# Patient Record
Sex: Female | Born: 1960 | Race: Black or African American | Hispanic: No | Smoking: Current every day smoker
Health system: Southern US, Community
[De-identification: ages and names within clinical notes are randomized; demographics above are authoritative.]

## PROBLEM LIST (undated history)

## (undated) DIAGNOSIS — F329 Major depressive disorder, single episode, unspecified: Secondary | ICD-10-CM

## (undated) DIAGNOSIS — I10 Essential (primary) hypertension: Secondary | ICD-10-CM

## (undated) DIAGNOSIS — M1990 Unspecified osteoarthritis, unspecified site: Secondary | ICD-10-CM

## (undated) DIAGNOSIS — F32A Depression, unspecified: Secondary | ICD-10-CM

## (undated) DIAGNOSIS — E7800 Pure hypercholesterolemia, unspecified: Secondary | ICD-10-CM

## (undated) DIAGNOSIS — M779 Enthesopathy, unspecified: Secondary | ICD-10-CM

## (undated) HISTORY — PX: JOINT REPLACEMENT: SHX530

## (undated) HISTORY — PX: TUBAL LIGATION: SHX77

## (undated) HISTORY — PX: HAND SURGERY: SHX662

---

## 1997-07-30 ENCOUNTER — Ambulatory Visit (HOSPITAL_COMMUNITY): Admission: RE | Admit: 1997-07-30 | Discharge: 1997-07-30 | Payer: Self-pay | Admitting: Internal Medicine

## 1997-12-25 ENCOUNTER — Other Ambulatory Visit: Admission: RE | Admit: 1997-12-25 | Discharge: 1997-12-25 | Payer: Self-pay | Admitting: Obstetrics and Gynecology

## 1998-01-25 HISTORY — PX: FOOT SURGERY: SHX648

## 1998-04-29 ENCOUNTER — Encounter: Payer: Self-pay | Admitting: Neurosurgery

## 1998-04-29 ENCOUNTER — Ambulatory Visit (HOSPITAL_COMMUNITY): Admission: RE | Admit: 1998-04-29 | Discharge: 1998-04-29 | Payer: Self-pay | Admitting: Neurosurgery

## 1998-07-18 ENCOUNTER — Ambulatory Visit (HOSPITAL_BASED_OUTPATIENT_CLINIC_OR_DEPARTMENT_OTHER): Admission: RE | Admit: 1998-07-18 | Discharge: 1998-07-18 | Payer: Self-pay | Admitting: Orthopedic Surgery

## 1998-12-30 ENCOUNTER — Emergency Department (HOSPITAL_COMMUNITY): Admission: EM | Admit: 1998-12-30 | Discharge: 1998-12-30 | Payer: Self-pay | Admitting: Emergency Medicine

## 1999-02-18 ENCOUNTER — Ambulatory Visit (HOSPITAL_COMMUNITY): Admission: RE | Admit: 1999-02-18 | Discharge: 1999-02-18 | Payer: Self-pay | Admitting: Orthopedic Surgery

## 1999-02-18 ENCOUNTER — Encounter: Payer: Self-pay | Admitting: Orthopedic Surgery

## 1999-03-09 ENCOUNTER — Emergency Department (HOSPITAL_COMMUNITY): Admission: EM | Admit: 1999-03-09 | Discharge: 1999-03-09 | Payer: Self-pay

## 1999-05-06 ENCOUNTER — Encounter: Admission: RE | Admit: 1999-05-06 | Discharge: 1999-05-06 | Payer: Self-pay | Admitting: Otolaryngology

## 1999-05-06 ENCOUNTER — Encounter: Payer: Self-pay | Admitting: Otolaryngology

## 1999-07-13 ENCOUNTER — Other Ambulatory Visit: Admission: RE | Admit: 1999-07-13 | Discharge: 1999-07-13 | Payer: Self-pay | Admitting: *Deleted

## 2000-07-14 ENCOUNTER — Other Ambulatory Visit: Admission: RE | Admit: 2000-07-14 | Discharge: 2000-07-14 | Payer: Self-pay | Admitting: *Deleted

## 2000-12-18 ENCOUNTER — Emergency Department (HOSPITAL_COMMUNITY): Admission: EM | Admit: 2000-12-18 | Discharge: 2000-12-18 | Payer: Self-pay | Admitting: Emergency Medicine

## 2003-01-10 ENCOUNTER — Emergency Department (HOSPITAL_COMMUNITY): Admission: EM | Admit: 2003-01-10 | Discharge: 2003-01-11 | Payer: Self-pay | Admitting: Emergency Medicine

## 2003-06-19 ENCOUNTER — Encounter (INDEPENDENT_AMBULATORY_CARE_PROVIDER_SITE_OTHER): Payer: Self-pay | Admitting: *Deleted

## 2003-06-19 ENCOUNTER — Ambulatory Visit (HOSPITAL_COMMUNITY): Admission: RE | Admit: 2003-06-19 | Discharge: 2003-06-19 | Payer: Self-pay | Admitting: Gastroenterology

## 2005-07-03 ENCOUNTER — Emergency Department (HOSPITAL_COMMUNITY): Admission: EM | Admit: 2005-07-03 | Discharge: 2005-07-03 | Payer: Self-pay | Admitting: Emergency Medicine

## 2006-02-23 ENCOUNTER — Encounter: Admission: RE | Admit: 2006-02-23 | Discharge: 2006-05-24 | Payer: Self-pay | Admitting: Unknown Physician Specialty

## 2006-09-22 ENCOUNTER — Inpatient Hospital Stay (HOSPITAL_COMMUNITY): Admission: RE | Admit: 2006-09-22 | Discharge: 2006-09-25 | Payer: Self-pay | Admitting: Orthopedic Surgery

## 2006-12-28 ENCOUNTER — Emergency Department (HOSPITAL_COMMUNITY): Admission: EM | Admit: 2006-12-28 | Discharge: 2006-12-28 | Payer: Self-pay | Admitting: Emergency Medicine

## 2007-02-14 ENCOUNTER — Ambulatory Visit (HOSPITAL_COMMUNITY): Admission: RE | Admit: 2007-02-14 | Discharge: 2007-02-15 | Payer: Self-pay | Admitting: Orthopaedic Surgery

## 2007-03-08 ENCOUNTER — Other Ambulatory Visit: Admission: RE | Admit: 2007-03-08 | Discharge: 2007-03-08 | Payer: Self-pay | Admitting: Family Medicine

## 2007-08-05 ENCOUNTER — Emergency Department (HOSPITAL_COMMUNITY): Admission: EM | Admit: 2007-08-05 | Discharge: 2007-08-06 | Payer: Self-pay | Admitting: Emergency Medicine

## 2007-08-09 ENCOUNTER — Encounter: Admission: RE | Admit: 2007-08-09 | Discharge: 2007-08-09 | Payer: Self-pay | Admitting: Family Medicine

## 2008-05-31 ENCOUNTER — Encounter: Admission: RE | Admit: 2008-05-31 | Discharge: 2008-05-31 | Payer: Self-pay | Admitting: Family Medicine

## 2008-06-08 IMAGING — CR DG KNEE 1-2V PORT*R*
2 series · 2 of 2 positions shown · non-contrast
Comparison: None

CLINICAL DATA: Postop total knee replacement

RIGHT KNEE - 2 VIEW

[view not recorded (1 of 2)]
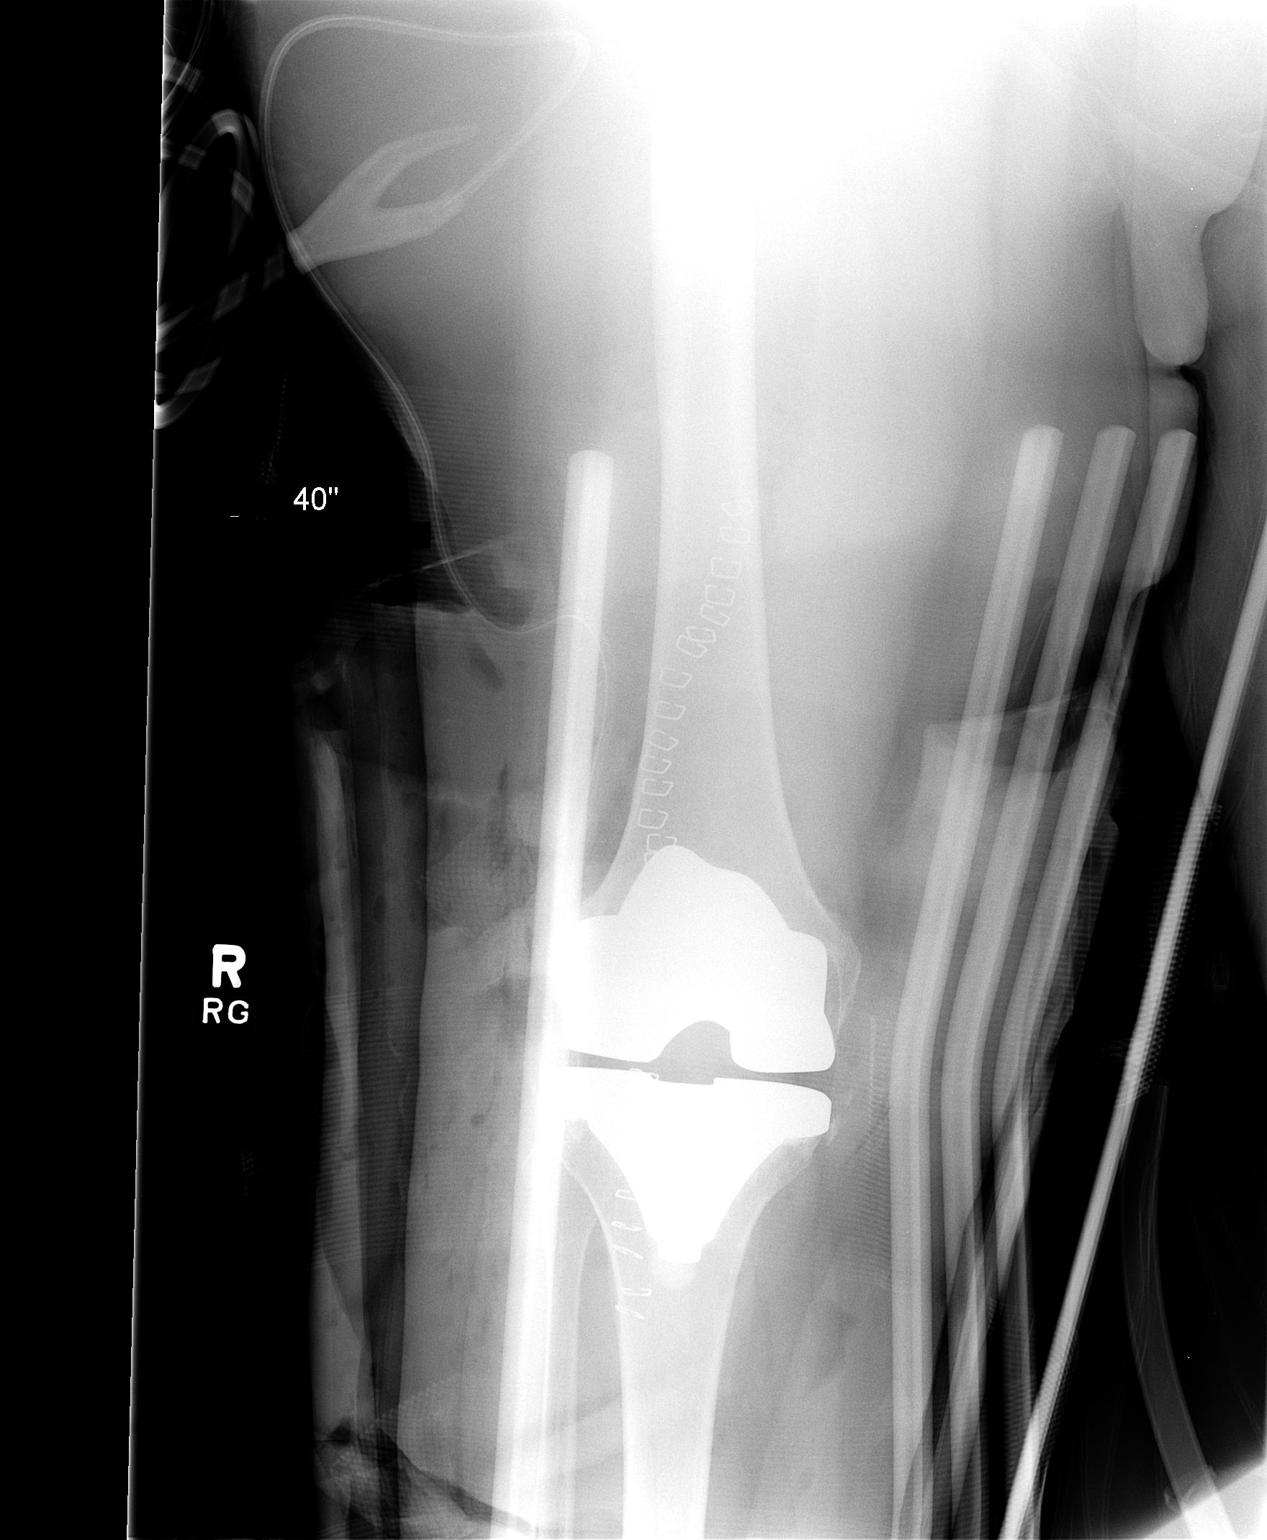

[view not recorded (2 of 2)]
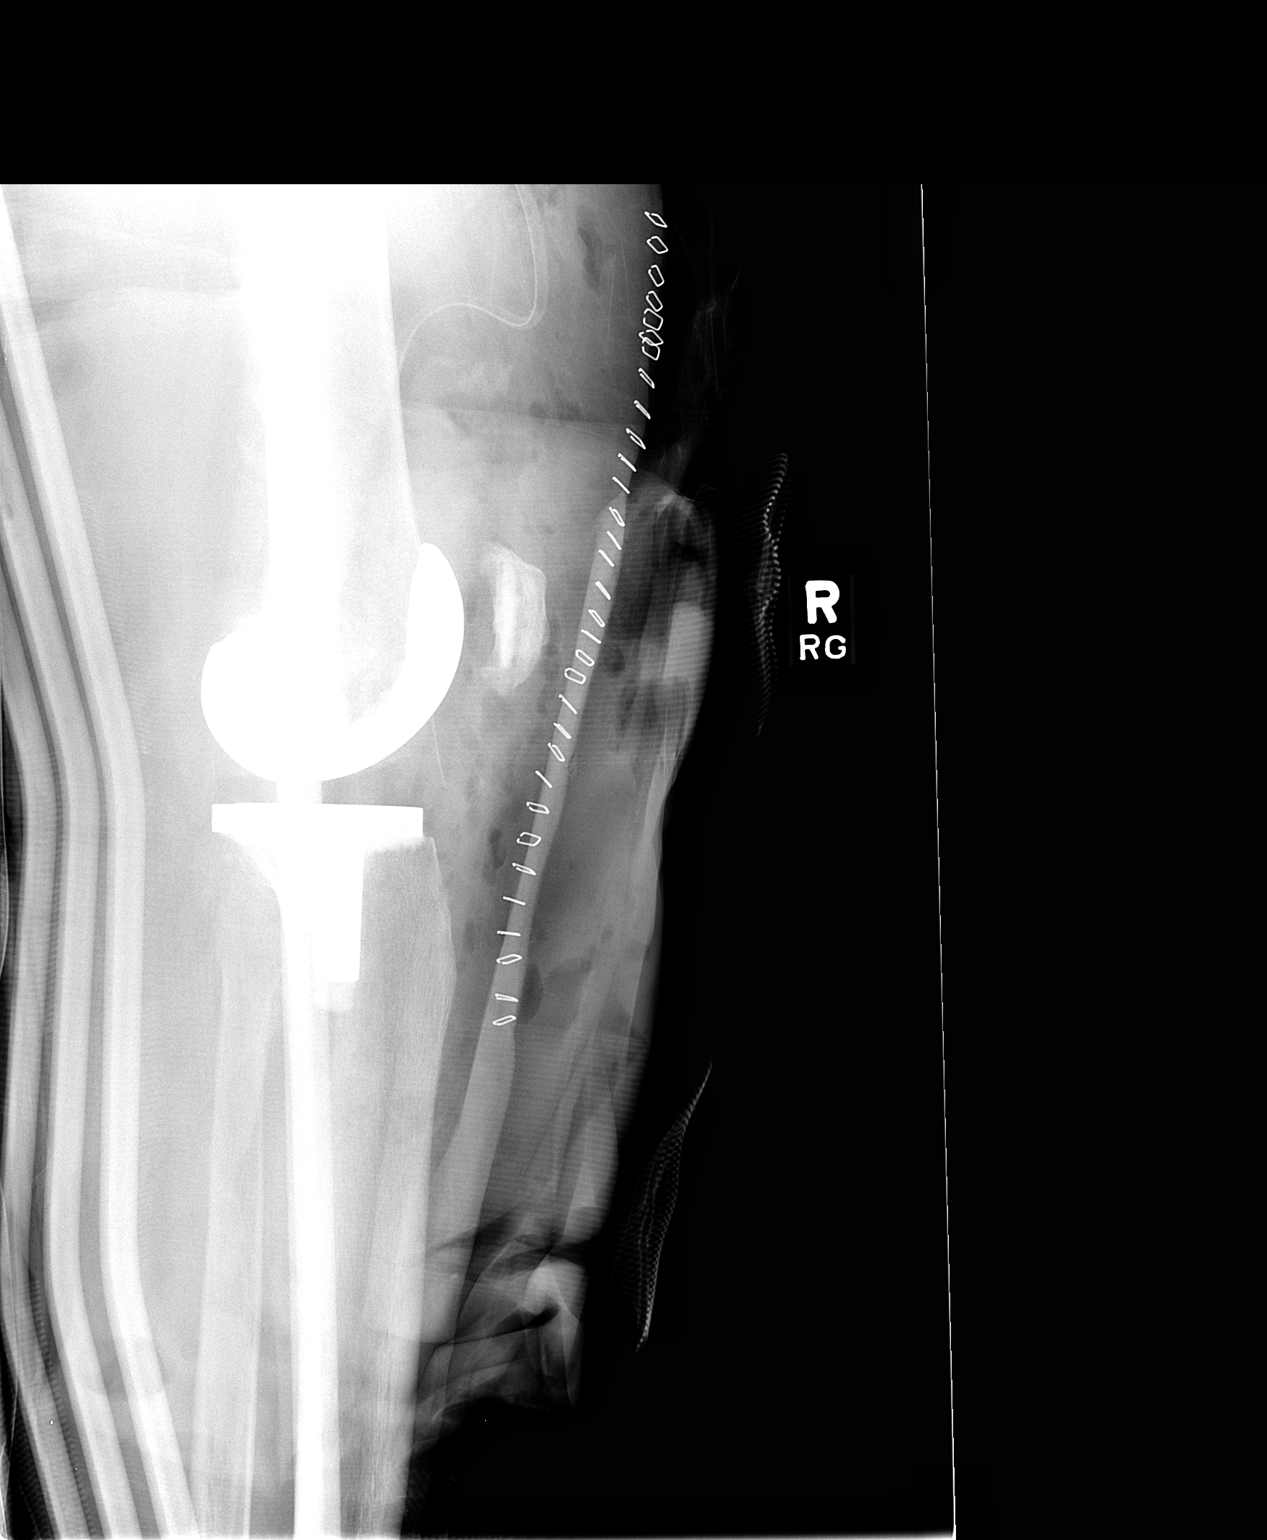

[2 of 2 positions shown; findings below may reference images not displayed]

FINDINGS: Status post total knee arthroplasty. No hardware complication
identified. Expected air and fluid within the joint.

IMPRESSION

Expected appearance after total knee arthroplasty.

## 2008-07-05 ENCOUNTER — Ambulatory Visit (HOSPITAL_COMMUNITY): Admission: RE | Admit: 2008-07-05 | Discharge: 2008-07-05 | Payer: Self-pay | Admitting: Neurosurgery

## 2008-07-06 ENCOUNTER — Emergency Department (HOSPITAL_COMMUNITY): Admission: EM | Admit: 2008-07-06 | Discharge: 2008-07-06 | Payer: Self-pay | Admitting: Emergency Medicine

## 2008-09-23 ENCOUNTER — Emergency Department (HOSPITAL_BASED_OUTPATIENT_CLINIC_OR_DEPARTMENT_OTHER): Admission: EM | Admit: 2008-09-23 | Discharge: 2008-09-23 | Payer: Self-pay | Admitting: Emergency Medicine

## 2008-09-23 ENCOUNTER — Ambulatory Visit: Payer: Self-pay | Admitting: Diagnostic Radiology

## 2008-10-18 ENCOUNTER — Emergency Department (HOSPITAL_COMMUNITY): Admission: EM | Admit: 2008-10-18 | Discharge: 2008-10-19 | Payer: Self-pay | Admitting: Emergency Medicine

## 2008-10-18 ENCOUNTER — Emergency Department (HOSPITAL_COMMUNITY): Admission: EM | Admit: 2008-10-18 | Discharge: 2008-10-18 | Payer: Self-pay | Admitting: Emergency Medicine

## 2009-04-10 ENCOUNTER — Other Ambulatory Visit: Admission: RE | Admit: 2009-04-10 | Discharge: 2009-04-10 | Payer: Self-pay | Admitting: Family Medicine

## 2009-10-19 ENCOUNTER — Emergency Department (HOSPITAL_COMMUNITY): Admission: EM | Admit: 2009-10-19 | Discharge: 2009-10-19 | Payer: Self-pay | Admitting: Emergency Medicine

## 2010-01-25 HISTORY — PX: KNEE SURGERY: SHX244

## 2010-01-25 HISTORY — PX: SHOULDER SURGERY: SHX246

## 2010-05-01 LAB — WET PREP, GENITAL
Clue Cells Wet Prep HPF POC: NONE SEEN
Trich, Wet Prep: NONE SEEN
WBC, Wet Prep HPF POC: NONE SEEN
Yeast Wet Prep HPF POC: NONE SEEN

## 2010-05-01 LAB — CBC
HCT: 39.4 % (ref 36.0–46.0)
HCT: 40.5 % (ref 36.0–46.0)
Hemoglobin: 12.9 g/dL (ref 12.0–15.0)
Hemoglobin: 13.1 g/dL (ref 12.0–15.0)
MCHC: 32.3 g/dL (ref 30.0–36.0)
MCHC: 32.9 g/dL (ref 30.0–36.0)
MCV: 88.2 fL (ref 78.0–100.0)
MCV: 89 fL (ref 78.0–100.0)
Platelets: 176 10*3/uL (ref 150–400)
Platelets: 182 10*3/uL (ref 150–400)
RBC: 4.46 MIL/uL (ref 3.87–5.11)
RBC: 4.55 MIL/uL (ref 3.87–5.11)
RDW: 15 % (ref 11.5–15.5)
RDW: 15.2 % (ref 11.5–15.5)
WBC: 10.6 10*3/uL — ABNORMAL HIGH (ref 4.0–10.5)
WBC: 9.6 10*3/uL (ref 4.0–10.5)

## 2010-05-01 LAB — DIFFERENTIAL
Basophils Absolute: 0 10*3/uL (ref 0.0–0.1)
Basophils Relative: 0 % (ref 0–1)
Eosinophils Absolute: 0.1 10*3/uL (ref 0.0–0.7)
Eosinophils Relative: 1 % (ref 0–5)
Lymphocytes Relative: 21 % (ref 12–46)
Lymphocytes Relative: 32 % (ref 12–46)
Lymphs Abs: 2.2 10*3/uL (ref 0.7–4.0)
Lymphs Abs: 3.1 10*3/uL (ref 0.7–4.0)
Monocytes Absolute: 0.5 10*3/uL (ref 0.1–1.0)
Monocytes Relative: 6 % (ref 3–12)
Neutro Abs: 5.9 10*3/uL (ref 1.7–7.7)
Neutrophils Relative %: 61 % (ref 43–77)
Neutrophils Relative %: 72 % (ref 43–77)

## 2010-05-01 LAB — URINALYSIS, ROUTINE W REFLEX MICROSCOPIC
Bilirubin Urine: NEGATIVE
Glucose, UA: NEGATIVE mg/dL
Hgb urine dipstick: NEGATIVE
Ketones, ur: NEGATIVE mg/dL
Nitrite: NEGATIVE
Protein, ur: NEGATIVE mg/dL
Specific Gravity, Urine: 1.018 (ref 1.005–1.030)
Urobilinogen, UA: 0.2 mg/dL (ref 0.0–1.0)
pH: 5 (ref 5.0–8.0)

## 2010-05-01 LAB — URINE MICROSCOPIC-ADD ON

## 2010-05-01 LAB — BASIC METABOLIC PANEL
BUN: 7 mg/dL (ref 6–23)
CO2: 25 mEq/L (ref 19–32)
Calcium: 9.2 mg/dL (ref 8.4–10.5)
Chloride: 105 mEq/L (ref 96–112)
Creatinine, Ser: 0.66 mg/dL (ref 0.4–1.2)
GFR calc Af Amer: >60 mL/min (ref >60)
GFR calc non Af Amer: >60 mL/min (ref >60)
Glucose, Bld: 97 mg/dL (ref 70–99)
Potassium: 4.2 mEq/L (ref 3.5–5.1)
Sodium: 135 mEq/L (ref 135–145)

## 2010-05-01 LAB — GC/CHLAMYDIA PROBE AMP, GENITAL
Chlamydia, DNA Probe: NEGATIVE
GC Probe Amp, Genital: NEGATIVE

## 2010-05-04 LAB — URINALYSIS, ROUTINE W REFLEX MICROSCOPIC
Nitrite: NEGATIVE
Specific Gravity, Urine: 1.033 — ABNORMAL HIGH (ref 1.005–1.030)
Urobilinogen, UA: 1 mg/dL (ref 0.0–1.0)

## 2010-06-09 NOTE — Op Note (Signed)
NAMEMAISYN, Kaitlyn NO.:  [id number]   MEDICAL RECORD NO.:  [id number]          PATIENT TYPE:  OIB   LOCATION:  5019                         FACILITY:  [facility]   PHYSICIAN:  Vanita Panda. 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   POSTOPERATIVE DIAGNOSIS:  1. Chronic right shoulder acromioclavicular separation.  2. Right shoulder impingement with bursitis.  3. Right shoulder partial thickness under surface rotator cuff tear.   PROCEDURE:  1. Right shoulder arthroscopy with extensive debridement.  2. Right shoulder arthroscopic subacromial decompression.  3. Right shoulder open distal clavicle resection.   SURGEON:  Vanita Panda. Magnus Ivan, M.D.   ANESTHESIA:  1. Right shoulder block.  2. General.   ANTIBIOTICS:  1 gram IV Ancef.   BLOOD LOSS:  Minimal.   COMPLICATIONS:  None.   INDICATIONS:  Briefly, Kaitlyn Schroeder is a 50 year old female who, over a  year ago, sustained an injury to her right shoulder.  She was found to  have a type 3 AC separation. This persisted for over the last year and  she started to develop a lot of pain in the [facility] joint, itself.  I did  perform weighted images of the shoulder in the office and showed no  increase in instability of the [facility] joint and films static and with the  weights in place showed no further movement. The clavicle did not have  much motion through it and her pain seemed to be in the [facility] joint.  I  recommended she undergo arthroscopic surgery with decompression with  debridement and an open distal clavicle resection.  This is mainly due  to the fact that I do not think ligament reconstruction is warranted at  this chronic date and especially with lack of motion of the [facility] joint, I  do not think I  could get this reduced sufficiently.  She did not have  evidence of instability and mainly just pain. The risks and benefits of  the surgery were explained to her, well understood, and she agreed to  proceed with surgery.   PROCEDURE DESCRIPTION:  After informed consent and the appropriate right  shoulder was marked, she was brought to the operating room and placed  supine on the operating table.  A block was obtained of the shoulder  before being brought back. General anesthesia was then obtained.  She  was positioned in a beach chair position with appropriate padding and  positioning of the head and neck as well as the down nonoperative arm.  The right arm was prepped and draped with DuraPrep and sterile drapes  and she was placed in in-line skeletal traction using a fishing pole  traction device and 10 pounds of weight with 45 degrees of forward  flexion and neutral rotation.  A time out was called and she was  identified as the correct patient and correct extremity.  I then  made a  posterior portal off the posterolateral edge of the acromion two  fingerbreadths distal and one fingerbreadth medial.  An incision was  made and a cannula was inserted into the glenohumeral joint.  The scope  was inserted and right away I went and made an anterior portal just  lateral to the coracoid process.  There was significant under surface  tearing of the rotator cuff but this was not a full thickness tear.  There was degenerative tearing of the labrum and synovitis in the  shoulder.  An arthroscopic soft tissue ablation wand was then inserted a  thorough debridement was carried out in the glenohumeral joint including  the under surface of the rotator cuff.  This was debrided, also, with  the arthroscopic shaver.  Next, the subacromial space was entered in the  posterior portal. I made a lateral portal just off the lateral edge of  the acromion.  There was significant thickened tissue and bursitis  all  around the rotator cuff with tendinosis, as well, and an arthroscopic  shaver was inserted.  I carried out a thorough bursectomy as well as  using a soft tissue ablation wand to clean the under surface of the  acromion at the [facility] joint where there was some bone spurs that I co-  planed using the high speed bur.  I then significantly cleaned off  tendinosis of the rotator cuff and found the rotator cuff to, otherwise,  be intact.  Next, all the instrumentation was removed from the shoulder  and I proceeded with the open portion of the case.  I made a small  incision directly over the [facility] joint and carried this down to the distal  clavicle.  I exposed the distal clavicle sharply and cleaned the soft  tissue debris and found significant bone spurring along this and  deformities of the cartilage. Using a high speed oscillating saw, I then  removed approximately 1.5 cm of bone and there was no further  derangement in the clavicle.  I thoroughly irrigated this wound and  closed the soft tissue over this with interrupted 2-0 FiberWire suture  followed by 0 Vicryl to supplement this, 0 Vicryl in the deep tissue,  and 2-0 Vicryl in the subcutaneous tissue.  The incision was closed with  interrupted 3-0 nylon as well as the portal incisions.  Xeroform  followed by a well padded dressing was applied.  The patient's shoulder  was placed in a shoulder sling.  She was awakened, extubated, and taken  to the recovery room in stable condition.      Vanita Panda. Magnus Ivan, M.D.  Electronically Signed     CYB/MEDQ  D:  02/14/2007  T:  02/14/2007  Job:  [id number]

## 2010-06-09 NOTE — Op Note (Signed)
NAMEELLARY, CASAMENTO NO.:  [id number]   MEDICAL RECORD NO.:  [id number]          PATIENT TYPE:  INP   LOCATION:  0001                         FACILITY:  [facility]   PHYSICIAN:  Marlowe Kays, M.D.  DATE OF BIRTH:  1960/10/10   DATE OF PROCEDURE:  09/22/2006  DATE OF DISCHARGE:                               OPERATIVE REPORT   PREOPERATIVE DIAGNOSIS:  Osteoarthritis of right knee.   POSTOPERATIVE DIAGNOSIS:  Osteoarthritis of right knee.   OPERATION:  Osteonics total knee replacement, right.   SURGEON:  Marlowe Kays, M.D.   ASSISTANT:  Mr. Adrian Blackwater.   ANESTHESIA:  Spinal.   PATHOLOGY AND JUSTIFICATION FOR PROCEDURE:  Significant valgus deformity  with bone-on-bone laterally and pain.  She had minimal flexion  contracture.   PROCEDURE:  Prophylactic antibiotics, satisfactory spinal anesthesia,  Foley catheter inserted.  Lateral hip stabilizer Sure Foot and pneumatic  tourniquet. Right leg was prepped with DuraPrep from tourniquet to ankle  and draped in sterile field.  Ioban employed.  Time-out performed.  A  vertical midline incision down to the patellar mechanism with median  parapatellar incision to open the joint.  Osteophytes.  She had  significant osteophytic changes in all compartments.  Osteophytes from  around the patella and femur were removed with a rongeur. Patellar  mechanism was freed up and knee flexed.  Most of the ACL and PCL were  removed at this time as well as anterior portion of both menisci.  I  made a 5/16 inch drill hole distal femur and followed this with the  canal finder and the axis aligner set for 5 degree valgus cut for the  right knee.  Took 10 mm of bone off the distal femur and then sized the  distal femur at a #7.  Scribe lines were placed and the distal femoral  cutting jig applied, making anterior and posterior cuts and posterior  and anterior chamfers.  I then made a leveling cut on the tibia and  removed the  remnants of the menisci and ACL and PCL.  I sized the tibia  to a #7 and using the 7 baseplate made my initial intramedullary drill  hole followed by step-cut drill, canal finder and the intramedullary rod  set for 4 mm cut off the depressed lateral tibial plateau.  Cut was made  90 degrees and after this cut was performed, I then placed the laminar  spreader and removed soft tissue and bone from the posterior femoral  condyles.  I then used the jig for creating both the patellar groove and  the notchplasty, both of which were performed.  I then went through a  trial reduction and found that a 10, perhaps a 12 mm spacer would be  appropriate.  Using an external rod and the tibial base plate with the  rod splitting bimalleolar distance I put scribe lines on the tibia for  tray placement later on.  Then with the knee in extension I sized the  patella at 26 and used the 10-mm recessed cutting jig with the jig being  biased to the medial side.  Then placed the jig for creating three  fixation holes and followed this with the trial patella trimming away  excess bone from around the perimeter.  I then stabilized the #7 tibial  base plate onto the plateau with three pins and used the tower apparatus  to ream for the tibial stem up to a 7 cemented.  I then water picked the  knee while methacrylate was being mixed.  I then individually glued in  the three components, starting first with the tibia, impacting it  tightly removing excess methyl methacrylate followed by the femur and  holding the knee in extension with 10 mm insert while we glued in the  patellar button and held it with a patellar clamp as well.  With the  methacrylate had hardened, we trimmed up small bits from around patella  and the other components and went through a another trial reduction,  this time with 12 mm spacer which gave [place] a better fit with the knee  going into neutral and good medial and lateral stability.  After   irrigating the wound well, we placed the final 12 mm posterior  stabilized insert, checking motion once again and extensive lateral  release of the patella was indicated and performed, patella tracked  nicely afterwards and placed Hemovac and closed the wound with two  layers of #1 Vicryl in the quadriceps tendon and two layers distally in  the synovium and capsule. Subcutaneous tissue was closed with the  combination of #1 and 2-0 Vicryl, staples in the skin.  Tourniquet was  released with an hour and 2 minutes of tourniquet time at 325 mmHg.  Betadine and Adaptic dry sterile dressing were applied.  Tolerated the  procedure well, was taken to recovery room satisfactory condition with  no known complications.           ______________________________  Marlowe Kays, M.D.     JA/MEDQ  D:  09/22/2006  T:  09/23/2006  Job:  [id number]

## 2010-06-12 NOTE — Discharge Summary (Signed)
Kaitlyn Schroeder, Kaitlyn Schroeder NO.:  [id number]   MEDICAL RECORD NO.:  [id number]          PATIENT TYPE:  INP   LOCATION:  1529                         FACILITY:  [facility]   PHYSICIAN:  Marlowe Kays, M.D.  DATE OF BIRTH:  1960-09-08   DATE OF ADMISSION:  09/22/2006  DATE OF DISCHARGE:  09/25/2006                               DISCHARGE SUMMARY   ADMITTING DIAGNOSES:  1. Osteoarthritis of right knee.  2. Hypertension.  3. Right ankle fracture (being treated by Dr. Chestine Spore).   DISCHARGE DIAGNOSES:  1. Osteoarthritis of right knee.  2. Hypertension.  3. Right ankle fracture (being treated by Dr. Chestine Spore).  4. Postoperative anemia.   OPERATION:  On September 22, 2006, the patient underwent Osteonics total  knee replacement arthroplasty of the right knee, D. Ashley Akin, PA-C,  assisted.   BRIEF HISTORY:  This 50 year old female with severe valgus deformity is  having more and more difficulty getting about secondary to laxity and  pain in the knee.  X-rays have shown bone-on-bone abutment to the  lateral compartment of the knee with a medial shift of the femur onto  the tibia.  After much discussion including risks and benefits of  surgery, we decided she would benefit with surgical intervention and was  admitted for the above procedure.   HOSPITAL COURSE:  The patient tolerated the surgery quite well, entered  into the total knee replacement protocol with eagerness.  Hemovac was  removed on the first postop day and dressing was changed on the second  postop day.  It was noted that she had a anemia with hemoglobin dropping  to 8.3.  She was transfused by Dr. Shon Baton with 2 units of PRBC bringing  her hemoglobin up to 10.  Occupational therapy found the patient  required very little instruction on ADLs as she was doing quite well,  walking in the hall 200+ feet.  Vital signs were stable.  Neurovascular  was intact in the operative extremity and incision was dry.  She was  discharged home to continue with home physical therapy per [place].   She was placed on Coumadin protocol postoperatively for prevention of  DVT and will continue so for four weeks after the date of surgery.   LABORATORY DATA:  Laboratory values in the hospital hematologically  showed a preoperative CBC completely within normal limits.  Final  hemoglobin was 10, hematocrit was 29.50.  Blood chemistries remained  normal.  Urinalysis negative for urinary tract infection.   Electrocardiogram showed normal sinus rhythm and a chest x-ray showed  small focus of opacity at the right base, favor atelectasis over  infection.   CONDITION ON DISCHARGE:  Improved, stable.   PLAN:  The patient discharged home in the care of her family to continue  with home physical therapy.  Use dry dressing to the knee as needed.  Continue with ice.  Use a knee immobilizer when she is up and about,  weightbearing as tolerated.   DISCHARGE MEDICATIONS:  From [place] are  1. Vicodin one to two q.4-6h. p.r.n. pain.  2. Robaxin 500  mg one tablet q.6h. p.r.n. muscle spasm.  3. Coumadin per pharmacy.  4. Trinsicon b.i.d.  5. Recommend she use a stool softener daily.   Return to see Dr. Simonne Come two weeks from the date of surgery.   OTHER DISCHARGE MEDICATIONS:  1. Avapro 300 mg tablet daily.  2. Calcium carbonate 500 mg daily w.c.  3. Nicotine patch versus her regular smoking.  4. Ultram for mild pain.   DIET:  Continue with diet that she was on prior to surgery.      Dooley L. Cherlynn June.    ______________________________  Marlowe Kays, M.D.    DLU/MEDQ  D:  10/05/2006  T:  10/05/2006  Job:  [id number]

## 2010-06-12 NOTE — H&P (Signed)
Kaitlyn Schroeder, Kaitlyn Schroeder NO.:  [id number]   MEDICAL RECORD NO.:  [id number]        PATIENT TYPE:  LINP   LOCATION:                               FACILITY:  [facility]   PHYSICIAN:  Marlowe Kays, M.D.  DATE OF BIRTH:  05-28-1960   DATE OF ADMISSION:  09/22/2006  DATE OF DISCHARGE:                              HISTORY & PHYSICAL   HISTORY OF PRESENT ILLNESS:  This 50 year old black female seen by [place]  for continuing progressive problems concerning her right knee.  She has  now developed a valgus deformity and has difficulty getting about.  There is no laxity to the knee, but she has pain and crepitus with range  of motion.  Standing x-rays show bone-on-bone abutment to the lateral  compartment of the knee joint with a medial shift to the femur onto the  tibia.  After much discussion including the risks and benefits of  surgery it was felt she would benefit from surgical intervention and is  scheduled for total knee replacement arthroplasty to the right knee.   Currently the patient is being treated for a right ankle fracture by Dr.  Chestine Spore of one of the urgent primary cares.  She now is wearing a Cam  walker fracture boot.  She is able to fully weightbear.  We have  reviewed the heights of the brace on the calf, and it should not  interfere at all with her postoperative rehabilitation.   PAST MEDICAL HISTORY:  She has a remote history of asthma and  bronchitis.  She is currently being treated for hypertension.  She has  some shortness of breath on exertion which is only rare.   CURRENT MEDICATIONS:  1. Benicar 40 mg.  2. Meloxicam 15 mg.  Will stop on September 14, 2006.  3. Hydrocodone for pain.  4. Carisoprodol for muscle spasms (Dr. Chestine Spore).   ALLERGIES:  She is allergic to CODEINE and CELEBREX.   PRIMARY CARE PHYSICIAN:  Dr. Louanna Raw is her primary care.   PAST SURGERIES:  Carpal tunnel release in 1990, tubal ligation about 24  years ago, tendinitis in 1999.   She had a dislocated shoulder on the  right in July 2007.   FAMILY HISTORY:  Positive for heart disease and hypertension in the  mother, diabetes in the father, and breast cancer in the family.   SOCIAL HISTORY:  The patient is single.  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  She smokes half-a-pack to a whole pack of  cigarettes per day (We have admonished her for this.  She will try to  quit).  No intake of alcohol products.  She has two children, and her  family will be major caregivers after her surgery.  Her home is a two-  story with 16 stairs inside and four outside.   REVIEW OF SYSTEMS:  CNS:  No seizures or paralysis, no double vision.  RESPIRATORY:  No productive cough, some shortness of breath.  No  dyspnea.  CARDIOVASCULAR:  No chest pain, no angina, no orthopnea.  GASTROINTESTINAL:  No  nausea, vomiting, melanotic stool.  GENITOURINARY:  The patient has some urinary frequency and nocturia.  MUSCULOSKELETAL:  Primarily in present illness.  She still has trouble with her right  shoulder from dislocation tendon injury last year.  She is doing fairly  well with the right ankle fracture.   PHYSICAL EXAMINATION:  GENERAL:  Alert, cooperative, friendly 45-year-  old black female accompanied by her daughter.  VITAL SIGNS:  Blood pressure 138/88, pulse 80 regular, respirations 12  unlabored.  HEENT:  Normocephalic, PERRLA, EOM's intact.  Oropharynx is clear.  CHEST:  Clear to auscultation, no rhonchi or rales.  HEART:  Regular rate and rhythm.  No murmurs are heard.  ABDOMEN:  Obese, soft, tender, liver spleen not felt.  GENITALIA/RECTAL/PELVIC/BREASTS:  Not done, not pertinent to present  illness.  EXTREMITIES:  The right knee as in present illness above.   ADMITTING DIAGNOSIS:  1. Osteoarthritis of the right knee.  2. Hypertension.  3. Right ankle fracture (being treated by Dr. Chestine Spore).   PLAN:  The patient will be admitted for right total knee replacement   arthroplasty.  Plan to have home health to help her family with her  rehabilitation.      Dooley L. Cherlynn June.    ______________________________  Marlowe Kays, M.D.    DLU/MEDQ  D:  09/05/2006  T:  09/06/2006  Job:  [id number]

## 2010-06-12 NOTE — Op Note (Signed)
NAME:  AUBURN, HESTER                            ACCOUNT NO.:  [id number]   MEDICAL RECORD NO.:  [id number]                   PATIENT TYPE:  AMB   LOCATION:  ENDO                                 FACILITY:  [facility]   PHYSICIAN:  Anselmo Rod, M.D.               DATE OF BIRTH:  10-19-1960   DATE OF PROCEDURE:  06/19/2003  DATE OF DISCHARGE:                                 OPERATIVE REPORT   PROCEDURE PERFORMED:  Colonoscopy with snare polypectomy times six.   ENDOSCOPIST:  Charna Elizabeth, M.D.   INSTRUMENT USED:  Olympus video colonoscope.   INDICATIONS FOR PROCEDURE:  The patient is a 50 year old African-American  female with a history of rectal bleeding and change in bowel habits and a  family history of breast, colon and stomach cancer undergoing colonoscopy.  Rule out colonic polyps, masses, etc.   PREPROCEDURE PREPARATION:  Informed consent was procured from the patient.  The patient was fasted for eight hours prior to the procedure and prepped  with a bottle of magnesium citrate and a gallon of GoLYTELY the night prior  to the procedure.   PREPROCEDURE PHYSICAL:  The patient had stable vital signs.  Neck supple.  Chest clear to auscultation.  S1 and S2 regular.  Abdomen soft with normal  bowel sounds.   DESCRIPTION OF PROCEDURE:  The patient was placed in left lateral decubitus  position and sedated with 100 mg of Demerol and 7.5 mg of Versed  intravenously.  Once the patient was adequately sedated and maintained on  low flow oxygen and continuous cardiac monitoring, the Olympus video  colonoscope was advanced from the rectum to the cecum.  The appendicular  orifice and ileocecal valve were clearly visualized and photographed.  There  was a large amount of residual stool in the colon.  The patient's position  had to changed from the left lateral to the supine position and then the  right lateral position with gentle application of abdominal pressure to  reach the cecum.  In  spite of multiple washes, visualization was not  adequate.  Three small sessile polyps were removed from the rectosigmoid  colon. Another three slightly larger polyps were removed from the distal  sigmoid colon.  Retroflexion in the rectum revealed small internal  hemorrhoids.  The patient tolerated the procedure well without  complications.  There was no evidence of diverticulosis.   IMPRESSION:  1. Six polyps were snared from the distal sigmoid and rectosigmoid colon.  2. Small internal hemorrhoids.  3. Large amount of residual stool in the colon.  Small lesions could have     been missed.   RECOMMENDATIONS:  1. Await pathology results.  2. Avoid all nonsteroidals including aspirin for the next four weeks.  3. Outpatient followup in the next two weeks for further recommendations.  Anselmo Rod, M.D.    JNM/MEDQ  D:  06/19/2003  T:  06/20/2003  Job:  [id number]   cc:   Gabriel Earing, M.D.  [address]  [place]  [place] [address]  Fax: [phone]

## 2010-06-22 ENCOUNTER — Emergency Department (HOSPITAL_COMMUNITY): Payer: BC Managed Care – PPO

## 2010-06-22 ENCOUNTER — Emergency Department (HOSPITAL_COMMUNITY)
Admission: EM | Admit: 2010-06-22 | Discharge: 2010-06-22 | Disposition: A | Discharge status: Home / Self Care | Payer: BC Managed Care – PPO | Attending: Emergency Medicine | Admitting: Emergency Medicine

## 2010-06-22 DIAGNOSIS — I509 Heart failure, unspecified: Secondary | ICD-10-CM | POA: Insufficient documentation

## 2010-06-22 DIAGNOSIS — R0602 Shortness of breath: Secondary | ICD-10-CM | POA: Insufficient documentation

## 2010-06-22 DIAGNOSIS — J40 Bronchitis, not specified as acute or chronic: Secondary | ICD-10-CM | POA: Insufficient documentation

## 2010-06-22 DIAGNOSIS — E7800 Pure hypercholesterolemia, unspecified: Secondary | ICD-10-CM | POA: Insufficient documentation

## 2010-06-22 DIAGNOSIS — J449 Chronic obstructive pulmonary disease, unspecified: Secondary | ICD-10-CM | POA: Insufficient documentation

## 2010-06-22 DIAGNOSIS — I10 Essential (primary) hypertension: Secondary | ICD-10-CM | POA: Insufficient documentation

## 2010-06-22 DIAGNOSIS — R059 Cough, unspecified: Secondary | ICD-10-CM | POA: Insufficient documentation

## 2010-06-22 DIAGNOSIS — J4489 Other specified chronic obstructive pulmonary disease: Secondary | ICD-10-CM | POA: Insufficient documentation

## 2010-06-22 DIAGNOSIS — R05 Cough: Secondary | ICD-10-CM | POA: Insufficient documentation

## 2010-06-22 LAB — BASIC METABOLIC PANEL
BUN: 8 mg/dL (ref 6–23)
CO2: 26 mEq/L (ref 19–32)
Calcium: 8.7 mg/dL (ref 8.4–10.5)
Creatinine, Ser: 0.63 mg/dL (ref 0.4–1.2)
GFR calc Af Amer: >60 mL/min (ref >60)
Glucose, Bld: 103 mg/dL — ABNORMAL HIGH (ref 70–99)

## 2010-06-22 LAB — CK TOTAL AND CKMB (NOT AT ARMC)
Relative Index: INVALID (ref 0.0–2.5)
Total CK: 98 U/L (ref 7–177)

## 2010-06-22 LAB — DIFFERENTIAL
Basophils Absolute: 0 10*3/uL (ref 0.0–0.1)
Basophils Relative: 0 % (ref 0–1)
Monocytes Absolute: 0.8 10*3/uL (ref 0.1–1.0)
Neutro Abs: 7.6 10*3/uL (ref 1.7–7.7)
Neutrophils Relative %: 67 % (ref 43–77)

## 2010-06-22 LAB — CBC
Hemoglobin: 12.2 g/dL (ref 12.0–15.0)
MCHC: 31.5 g/dL (ref 30.0–36.0)
RBC: 4.64 MIL/uL (ref 3.87–5.11)

## 2010-07-02 ENCOUNTER — Other Ambulatory Visit (HOSPITAL_COMMUNITY)
Admission: RE | Admit: 2010-07-02 | Discharge: 2010-07-02 | Disposition: A | Discharge status: Home / Self Care | Payer: BC Managed Care – PPO | Source: Ambulatory Visit | Attending: Family Medicine | Admitting: Family Medicine

## 2010-07-02 ENCOUNTER — Other Ambulatory Visit: Payer: Self-pay | Admitting: Family Medicine

## 2010-07-02 DIAGNOSIS — Z124 Encounter for screening for malignant neoplasm of cervix: Secondary | ICD-10-CM | POA: Insufficient documentation

## 2010-10-15 LAB — BASIC METABOLIC PANEL
BUN: 9
CO2: 29
Calcium: 9.3
Creatinine, Ser: 0.61
GFR calc Af Amer: >60
Glucose, Bld: 89

## 2010-10-15 LAB — CBC
MCHC: 32.6
Platelets: 185
RDW: 14.7

## 2010-10-22 LAB — POCT I-STAT, CHEM 8
Calcium, Ion: 1.19
Creatinine, Ser: 0.9
Hemoglobin: 14.3
Sodium: 139
TCO2: 28

## 2010-10-22 LAB — DIFFERENTIAL
Basophils Relative: 1
Eosinophils Absolute: 0.2
Eosinophils Relative: 2
Monocytes Relative: 6
Neutrophils Relative %: 58

## 2010-10-22 LAB — URINALYSIS, ROUTINE W REFLEX MICROSCOPIC
Protein, ur: NEGATIVE
Urobilinogen, UA: 1

## 2010-10-22 LAB — CBC
HCT: 39.6
MCHC: 32.5
MCV: 86
Platelets: 214

## 2010-10-22 LAB — URINE MICROSCOPIC-ADD ON

## 2010-10-22 LAB — LIPASE, BLOOD: Lipase: 16

## 2010-11-06 LAB — CBC
HCT: 25.1 — ABNORMAL LOW
HCT: 29.5 — ABNORMAL LOW
HCT: 29.8 — ABNORMAL LOW
Hemoglobin: 9.8 — ABNORMAL LOW
MCHC: 32.8
MCHC: 32.8
MCHC: 33
MCHC: 33.8
MCV: 85.6
MCV: 85.9
MCV: 86.1
MCV: 86.2
Platelets: 110 — ABNORMAL LOW
Platelets: 111 — ABNORMAL LOW
Platelets: 208
RBC: 3.43 — ABNORMAL LOW
RBC: 3.47 — ABNORMAL LOW
RBC: 4.69
RDW: 14.1 — ABNORMAL HIGH
WBC: 7.5

## 2010-11-06 LAB — BASIC METABOLIC PANEL
BUN: 4 — ABNORMAL LOW
CO2: 25
CO2: 29
Calcium: 8.5
Chloride: 104
Chloride: 105
Creatinine, Ser: 0.6
Glucose, Bld: 123 — ABNORMAL HIGH
Potassium: 3.9
Potassium: 4.3
Sodium: 137

## 2010-11-06 LAB — DIFFERENTIAL
Eosinophils Absolute: 0.2
Eosinophils Relative: 2
Lymphocytes Relative: 34
Lymphs Abs: 3.4 — ABNORMAL HIGH
Monocytes Absolute: 0.7
Monocytes Relative: 7

## 2010-11-06 LAB — COMPREHENSIVE METABOLIC PANEL
ALT: 12
AST: 17
Albumin: 3.4 — ABNORMAL LOW
CO2: 28
Calcium: 9.6
Creatinine, Ser: 0.77
GFR calc Af Amer: >60
Sodium: 144
Total Protein: 7

## 2010-11-06 LAB — TYPE AND SCREEN: ABO/RH(D): O POS

## 2010-11-06 LAB — URINALYSIS, ROUTINE W REFLEX MICROSCOPIC
Hgb urine dipstick: NEGATIVE
Ketones, ur: NEGATIVE
Protein, ur: NEGATIVE
Urobilinogen, UA: 0.2

## 2010-11-06 LAB — PROTIME-INR
Prothrombin Time: 12.6
Prothrombin Time: 17.5 — ABNORMAL HIGH
Prothrombin Time: 18.7 — ABNORMAL HIGH

## 2010-12-12 DIAGNOSIS — R51 Headache: Secondary | ICD-10-CM | POA: Insufficient documentation

## 2010-12-12 DIAGNOSIS — M542 Cervicalgia: Secondary | ICD-10-CM | POA: Insufficient documentation

## 2010-12-13 ENCOUNTER — Encounter: Payer: Self-pay | Admitting: *Deleted

## 2010-12-13 ENCOUNTER — Emergency Department (HOSPITAL_COMMUNITY)
Admission: EM | Admit: 2010-12-13 | Discharge: 2010-12-13 | Discharge status: Against Medical Advice | Payer: BC Managed Care – PPO | Attending: Emergency Medicine | Admitting: Emergency Medicine

## 2010-12-13 HISTORY — DX: Major depressive disorder, single episode, unspecified: F32.9

## 2010-12-13 HISTORY — DX: Depression, unspecified: F32.A

## 2010-12-13 HISTORY — DX: Pure hypercholesterolemia, unspecified: E78.00

## 2010-12-13 NOTE — ED Notes (Signed)
Pt received to room at this time 

## 2010-12-13 NOTE — ED Notes (Signed)
Pt states on Wed her neck started to hurt. Pt states saw PCP and was rx'd flexeril. Pt has been taking tylenol and flexeril w/o relief. 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 Pt states pain is radiating from her neck up to her temple.

## 2010-12-28 ENCOUNTER — Other Ambulatory Visit: Payer: Self-pay | Admitting: Family Medicine

## 2010-12-29 ENCOUNTER — Ambulatory Visit
Admission: RE | Admit: 2010-12-29 | Discharge: 2010-12-29 | Disposition: A | Discharge status: Home / Self Care | Payer: BC Managed Care – PPO | Source: Ambulatory Visit | Attending: Family Medicine | Admitting: Family Medicine

## 2011-08-21 ENCOUNTER — Emergency Department (HOSPITAL_BASED_OUTPATIENT_CLINIC_OR_DEPARTMENT_OTHER)
Admission: EM | Admit: 2011-08-21 | Discharge: 2011-08-21 | Disposition: A | Discharge status: Home / Self Care | Payer: BC Managed Care – PPO | Attending: Emergency Medicine | Admitting: Emergency Medicine

## 2011-08-21 ENCOUNTER — Emergency Department (HOSPITAL_BASED_OUTPATIENT_CLINIC_OR_DEPARTMENT_OTHER): Payer: BC Managed Care – PPO

## 2011-08-21 ENCOUNTER — Encounter (HOSPITAL_BASED_OUTPATIENT_CLINIC_OR_DEPARTMENT_OTHER): Payer: Self-pay | Admitting: *Deleted

## 2011-08-21 DIAGNOSIS — R51 Headache: Secondary | ICD-10-CM

## 2011-08-21 DIAGNOSIS — F341 Dysthymic disorder: Secondary | ICD-10-CM | POA: Insufficient documentation

## 2011-08-21 DIAGNOSIS — F17200 Nicotine dependence, unspecified, uncomplicated: Secondary | ICD-10-CM | POA: Insufficient documentation

## 2011-08-21 DIAGNOSIS — E7800 Pure hypercholesterolemia, unspecified: Secondary | ICD-10-CM | POA: Insufficient documentation

## 2011-08-21 DIAGNOSIS — Z79899 Other long term (current) drug therapy: Secondary | ICD-10-CM | POA: Insufficient documentation

## 2011-08-21 DIAGNOSIS — J329 Chronic sinusitis, unspecified: Secondary | ICD-10-CM

## 2011-08-21 MED ORDER — DIPHENHYDRAMINE HCL 50 MG/ML IJ SOLN
12.5000 mg | Freq: Once | INTRAMUSCULAR | Status: AC
  Administered 2011-08-21: 12.5 mg via INTRAVENOUS
  Filled 2011-08-21: qty 1

## 2011-08-21 MED ORDER — SUMATRIPTAN SUCCINATE 6 MG/0.5ML ~~LOC~~ SOLN
6.0000 mg | Freq: Once | SUBCUTANEOUS | Status: AC
  Administered 2011-08-21: 6 mg via SUBCUTANEOUS
  Filled 2011-08-21: qty 0.5

## 2011-08-21 MED ORDER — SODIUM CHLORIDE 0.9 % IV BOLUS (SEPSIS)
1000.0000 mL | Freq: Once | INTRAVENOUS | Status: AC
  Administered 2011-08-21: 1000 mL via INTRAVENOUS

## 2011-08-21 MED ORDER — METOCLOPRAMIDE HCL 5 MG/ML IJ SOLN
10.0000 mg | Freq: Once | INTRAMUSCULAR | Status: AC
  Administered 2011-08-21: 10 mg via INTRAVENOUS
  Filled 2011-08-21: qty 2

## 2011-08-21 MED ORDER — PROMETHAZINE HCL 25 MG PO TABS: 25.0000 mg | ORAL_TABLET | Freq: Four times a day (QID) | ORAL | Status: DC | PRN

## 2011-08-21 MED ORDER — KETOROLAC TROMETHAMINE 30 MG/ML IJ SOLN
30.0000 mg | Freq: Once | INTRAMUSCULAR | Status: AC
  Administered 2011-08-21: 30 mg via INTRAVENOUS
  Filled 2011-08-21: qty 1

## 2011-08-21 MED ORDER — SUMATRIPTAN SUCCINATE 100 MG PO TABS: 100.0000 mg | ORAL_TABLET | ORAL | Status: DC | PRN

## 2011-08-21 MED ORDER — PREDNISONE 50 MG PO TABS: ORAL_TABLET | ORAL | Status: AC

## 2011-08-21 NOTE — ED Notes (Signed)
Pt sattes she is having recurrent H/A's with nausea and has been told it's "stress" or sinuses. Similar s/s today.

## 2011-08-21 NOTE — ED Provider Notes (Signed)
History     CSN: [id number]  Arrival date & time 08/21/11  1932   First MD Initiated Contact with Patient 08/21/11 1941      Chief Complaint  Patient presents with  . Headache    (Consider location/radiation/quality/duration/timing/severity/associated sxs/prior treatment) HPI  Patient presents to emergency department complaining of gradual onset headache that began today and has been progressively worsening throughout the day that's associated with nausea and photophobia. Patient states that she had similar headache 2 weeks ago when she was traveling in [place] and had to go to emergency department. Patient states that at that time blood work was performed and she was told that she had likely stress headache or sinus headache. Patient states that about a year ago she was evaluated for headaches at [facility] ER but does not remember the outcome of that visit. Patient states that the headache is on the left side of her head and radiates to the temporal region to the posterior skull. She denies any fevers, chills, visual changes, neck stiffness, dizziness, rash, difficulty ambulating or loss of coordination. Patient took nothing for headache prior to arrival. Patient states that in the past she has been evaluated by an ear nose and throat specialist for sinus problems but she has not followed up with him in quite some time however she just finished a course of abx by her PCP for sinusitis. Denies alleviating factors.   Past Medical History  Diagnosis Date  . Hypercholesteremia   . Asthma   . Depression     Past Surgical History  Procedure Date  . Joint replacement   . Hand surgery     Bilateral hands    History reviewed. No pertinent family history.  History  Substance Use Topics  . Smoking status: Current Everyday Smoker -- 0.5 packs/day  . Smokeless tobacco: Not on file  . Alcohol Use: No    OB History    Grav Para Term Preterm Abortions TAB SAB Ect Mult Living          Review of Systems  All other systems reviewed and are negative.    Allergies  Celebrex; Codeine; Etodolac; and Percocet  Home Medications   Current Outpatient Rx  Name Route Sig Dispense Refill  . BUPROPION HCL ER (SR) 150 MG PO TB12 Oral Take 150 mg by mouth 2 (two) times daily.      Marland Kitchen CALCIUM CARBONATE 600 MG PO TABS Oral Take 2,400 mg by mouth daily.      Marland Kitchen VITAMIN D 1000 UNITS PO TABS Oral Take 1,000 Units by mouth daily.      Marland Kitchen FLUTICASONE PROPIONATE 50 MCG/ACT NA SUSP Nasal Place 2 sprays into the nose daily.      Marland Kitchen SIMVASTATIN 20 MG PO TABS Oral Take 20 mg by mouth at bedtime.     . ALBUTEROL SULFATE HFA 108 (90 BASE) MCG/ACT IN AERS Inhalation Inhale 2 puffs into the lungs every 6 (six) hours as needed. Shortness of breath      . CYCLOBENZAPRINE HCL 10 MG PO TABS Oral Take 10 mg by mouth 3 (three) times daily as needed. Muscle spasm     . PREDNISONE 50 MG PO TABS  Take 1 tablet by mouth daily for total of 5 days. 5 tablet 0  . PROMETHAZINE HCL 25 MG PO TABS Oral Take 1 tablet (25 mg total) by mouth every 6 (six) hours as needed for nausea. 30 tablet 0  . SUMATRIPTAN SUCCINATE 100 MG PO TABS Oral  Take 1 tablet (100 mg total) by mouth every 2 (two) hours as needed for migraine. 10 tablet 0    BP 164/100  Pulse 90  Temp 97.9 F (36.6 C) (Oral)  Resp 20  Ht 5' (1.524 m)  Wt 213 lb (96.616 kg)  BMI 41.60 kg/m2  SpO2 99%  Physical Exam  Nursing note and vitals reviewed. Constitutional: She is oriented to person, place, and time. She appears well-developed and well-nourished. No distress.  HENT:  Head: Normocephalic and atraumatic.       No TTP of bilateral sinuses with percusssion  Eyes: Conjunctivae and EOM are normal. Pupils are equal, round, and reactive to light.  Neck: Normal range of motion. Neck supple.       No meningeal signs.   Cardiovascular: Normal rate, regular rhythm, normal heart sounds and intact distal pulses.  Exam reveals no gallop and no  friction rub.   No murmur heard. Pulmonary/Chest: Effort normal and breath sounds normal. No respiratory distress. She has no wheezes. She has no rales. She exhibits no tenderness.  Abdominal: Soft. Bowel sounds are normal. She exhibits no distension and no mass. There is no tenderness. There is no rebound and no guarding.  Musculoskeletal: Normal range of motion. She exhibits no edema and no tenderness.  Neurological: She is alert and oriented to person, place, and time. No cranial nerve deficit. Coordination normal.  Skin: Skin is warm and dry. No rash noted. She is not diaphoretic. No erythema.  Psychiatric: She has a normal mood and affect.    ED Course  Procedures (including critical care time)  IV toradol, reglan, benadryl, IM imitrex  Labs Reviewed - No data to display Ct Head Wo Contrast  08/21/2011  *RADIOLOGY REPORT*  Clinical Data: Headache  CT HEAD WITHOUT CONTRAST  Technique:  Contiguous axial images were obtained from the base of the skull through the vertex without contrast.  Comparison: 12/29/2010  Findings: Air fluid levels are noted in both maxillary sinuses. Mucosal thickening in the ethmoid air cells and maxillary sinuses. Mastoid air cells on the left containing small amount of fluid. Right mastoid air cells clear.  Adenoidal lymphoid tissue is prominent.  No mass effect, midline shift, or acute intracranial hemorrhage.  Streak artifact limits the study.  IMPRESSION: Inflammatory changes in the paranasal sinuses are present including air fluid levels in the maxillary sinuses.  Otherwise no acute intracranial pathology.  Original Report Authenticated By: Donavan Burnet, M.D.     1. Headache   2. Sinusitis       MDM  Patient is afebrile and nontoxic-appearing with no meningeal signs. She has complete resolution of headache after her migraine cocktail. CT scan shows no acute intracranial abnormalities and I have no concern for subarachnoid hemorrhage. I question migraine  versus chronic sinusitis given the fact the patient just finished a antibiotic for sinusitis and in her prior CT scan a year ago showed sinus fullness. She has an ears nose throat specialist who she's seen in the past and she is agreeable to following up closely however I will prescribe her some migraine medication given her complete resolution of pain with medications.        [place], [place] 08/21/11 2204

## 2011-08-22 NOTE — ED Provider Notes (Signed)
Medical screening examination/treatment/procedure(s) were performed by non-physician practitioner and as supervising physician I was immediately available for consultation/collaboration.  Kebron Pulse, MD 08/22/11 0856 

## 2012-01-03 ENCOUNTER — Institutional Professional Consult (permissible substitution): Payer: BC Managed Care – PPO | Admitting: Internal Medicine

## 2012-10-22 ENCOUNTER — Encounter (HOSPITAL_COMMUNITY): Payer: Self-pay | Admitting: *Deleted

## 2012-10-22 ENCOUNTER — Emergency Department (HOSPITAL_COMMUNITY)
Admission: EM | Admit: 2012-10-22 | Discharge: 2012-10-22 | Disposition: A | Discharge status: Home / Self Care | Payer: BC Managed Care – PPO | Attending: Emergency Medicine | Admitting: Emergency Medicine

## 2012-10-22 DIAGNOSIS — J069 Acute upper respiratory infection, unspecified: Secondary | ICD-10-CM | POA: Insufficient documentation

## 2012-10-22 DIAGNOSIS — IMO0002 Reserved for concepts with insufficient information to code with codable children: Secondary | ICD-10-CM | POA: Insufficient documentation

## 2012-10-22 DIAGNOSIS — J3489 Other specified disorders of nose and nasal sinuses: Secondary | ICD-10-CM | POA: Insufficient documentation

## 2012-10-22 DIAGNOSIS — Z8659 Personal history of other mental and behavioral disorders: Secondary | ICD-10-CM | POA: Insufficient documentation

## 2012-10-22 DIAGNOSIS — I10 Essential (primary) hypertension: Secondary | ICD-10-CM | POA: Insufficient documentation

## 2012-10-22 DIAGNOSIS — F17200 Nicotine dependence, unspecified, uncomplicated: Secondary | ICD-10-CM | POA: Insufficient documentation

## 2012-10-22 DIAGNOSIS — Z79899 Other long term (current) drug therapy: Secondary | ICD-10-CM | POA: Insufficient documentation

## 2012-10-22 DIAGNOSIS — E7800 Pure hypercholesterolemia, unspecified: Secondary | ICD-10-CM | POA: Insufficient documentation

## 2012-10-22 DIAGNOSIS — J45909 Unspecified asthma, uncomplicated: Secondary | ICD-10-CM | POA: Insufficient documentation

## 2012-10-22 DIAGNOSIS — M129 Arthropathy, unspecified: Secondary | ICD-10-CM | POA: Insufficient documentation

## 2012-10-22 HISTORY — DX: Unspecified osteoarthritis, unspecified site: M19.90

## 2012-10-22 HISTORY — DX: Enthesopathy, unspecified: M77.9

## 2012-10-22 HISTORY — DX: Essential (primary) hypertension: I10

## 2012-10-22 MED ORDER — OXYMETAZOLINE HCL 0.05 % NA SOLN
1.0000 | Freq: Once | NASAL | Status: AC
  Administered 2012-10-22: 1 via NASAL
  Filled 2012-10-22: qty 15

## 2012-10-22 MED ORDER — GUAIFENESIN ER 600 MG PO TB12: 1200.0000 mg | ORAL_TABLET | Freq: Two times a day (BID) | ORAL | Status: AC

## 2012-10-22 NOTE — ED Provider Notes (Signed)
CSN: [id number]     Arrival date & time 10/22/12  2001 History  This chart was scribed for non-physician practitioner Ivonne Andrew, working with Nelia Shi, MD by Carl Best, ED Scribe. This patient was seen in room WTR7/WTR7 and the patient's care was started at 9:27 PM.    Chief Complaint  Patient presents with  . URI    Patient is a 52 y.o. female presenting with URI. The history is provided by the patient. No language interpreter was used.  URI Presenting symptoms: cough   Presenting symptoms: no ear pain and no fever    HPI Comments: Kaitlyn Schroeder is a 52 y.o. female with a history of asthma, tendonitis, and arthritis, who presents to the Emergency Department complaining of reoccurring sinus infections.  She states that the pressure in her sinuses is causing nose pain and pain to the right side of her face and awoke her out of her sleep.  The patient states that she finished her antibiotic regimen last week and her sinus infection started again.  The patient denies fever, chills, diaphoresis, and ear pain as associated symptoms.  She states lists a slight cough as an associated symptom.  She states that she was using Allegra D, Nasnoex, and generic allergy medication with no relief.  The patient states that she has visited an ENT specialist and was told that she does not have problems with her sinuses.  The patient has a history of sinus surgery.  The patient has a family history of sinus problems and asthma.     Past Medical History  Diagnosis Date  . Hypercholesteremia   . Asthma   . Depression   . Hypertension   . Arthritis   . Tendonitis    Past Surgical History  Procedure Laterality Date  . Joint replacement    . Hand surgery      Bilateral hands   No family history on file. History  Substance Use Topics  . Smoking status: Current Every Day Smoker -- 0.50 packs/day    Types: Cigarettes  . Smokeless tobacco: Not on file  . Alcohol Use: No   OB History   Grav  Para Term Preterm Abortions TAB SAB Ect Mult Living                 Review of Systems  Constitutional: Negative for fever, chills and diaphoresis.  HENT: Negative for ear pain.   Respiratory: Positive for cough.     Allergies  Celebrex; Codeine; Etodolac; and Percocet  Home Medications   Current Outpatient Rx  Name  Route  Sig  Dispense  Refill  . albuterol (PROVENTIL HFA;VENTOLIN HFA) 108 (90 BASE) MCG/ACT inhaler   Inhalation   Inhale 2 puffs into the lungs every 6 (six) hours as needed. Shortness of breath           . calcium carbonate (OS-CAL) 600 MG TABS   Oral   Take 2,400 mg by mouth daily.           . cetirizine (ZYRTEC) 10 MG tablet   Oral   Take 10 mg by mouth daily.         . cholecalciferol (VITAMIN D) 1000 UNITS tablet   Oral   Take 1,000 Units by mouth daily.           . mometasone (NASONEX) 50 MCG/ACT nasal spray   Nasal   Place 2 sprays into the nose daily.  Triage Vitals: BP 153/94  Pulse 87  Temp(Src) 99.2 F (37.3 C) (Oral)  Resp 20  SpO2 98%  Physical Exam  Nursing note and vitals reviewed. Constitutional: She is oriented to person, place, and time. She appears well-developed and well-nourished. No distress.  HENT:  Head: Normocephalic and atraumatic.  Right Ear: External ear normal.  Left Ear: External ear normal.  Nose: Nose normal.  Mouth/Throat: Oropharynx is clear and moist.  Pressure over the maxillary sinus.  Eyes: Conjunctivae and EOM are normal. Pupils are equal, round, and reactive to light.  Neck: Neck supple.  Cardiovascular: Normal rate, regular rhythm, normal heart sounds and intact distal pulses.   Pulmonary/Chest: Effort normal.  Neurological: She is alert and oriented to person, place, and time.  Skin: Skin is warm and dry. She is not diaphoretic.  Psychiatric: She has a normal mood and affect.    ED Course  Procedures   DIAGNOSTIC STUDIES: Oxygen Saturation is 98% on room air, normal by my  interpretation.    COORDINATION OF CARE: 9:32 PM- Discussed administering  the patient with Afrin and Mucinex with the patient and the patient agreed to the treatment plan.       MDM   1. Sinus pain      Patient seen and evaluated. Patient has been on antibiotics recently for signs issues. She does not appear severely ill or toxic.  I personally performed the services described in this documentation, which was scribed in my presence. The recorded information has been reviewed and is accurate.   Angus Seller, PA-C 10/23/12 [phone]

## 2012-10-22 NOTE — ED Notes (Signed)
Pt states that she had a sinus infection approx 2 weeks and finished her anbx last week; pt states that just a couple days after finishing the anbx pt c/o sinus congestion, drainage and facial pain radiating into jaw and teeth. No cough at present but pt states that the drainage wakes her up at night

## 2012-11-03 NOTE — ED Provider Notes (Signed)
Medical screening examination/treatment/procedure(s) were performed by non-physician practitioner and as supervising physician I was immediately available for consultation/collaboration.    Patria Warzecha L Aariv Medlock, MD 11/03/12 0800 

## 2012-11-22 ENCOUNTER — Encounter: Payer: Self-pay | Admitting: Neurology

## 2012-11-22 ENCOUNTER — Ambulatory Visit (INDEPENDENT_AMBULATORY_CARE_PROVIDER_SITE_OTHER): Payer: BC Managed Care – PPO | Admitting: Neurology

## 2012-11-22 VITALS — BP 116/93 | HR 86 | Ht 59.5 in | Wt 226.0 lb

## 2012-11-22 DIAGNOSIS — R51 Headache: Secondary | ICD-10-CM

## 2012-11-22 DIAGNOSIS — R519 Headache, unspecified: Secondary | ICD-10-CM | POA: Insufficient documentation

## 2012-11-22 MED ORDER — SUMATRIPTAN SUCCINATE 25 MG PO TABS: 25.0000 mg | ORAL_TABLET | ORAL | Status: AC | PRN

## 2012-11-22 NOTE — Progress Notes (Signed)
[facility]    Provider:  Dr Hosie Poisson Referring Provider: Emeterio Reeve, MD Primary Care Physician:  Emeterio Reeve, MD  CC:  headache  HPI:  Kaitlyn Schroeder is a 52 y.o. female here as a referral from Dr. Pollyann Kennedy (ENT) for headache  Starting around 1 yr ago, keeps waking up in the middle of the night, notes the left side of her face hurts. Typically wakes up around 1-2am,waking up due to pain on L side of face. Describes as pins and needles stabbing type sensation, can last up to 4-5 hours. At worst, a 10/10.  No nausea or vomiting. Gets sensitive to light and sound. No change in vision. Gets up and paces around. Gets tearing up of her L eye, some conjunctival injection, mild nasal congestion. Goes away on its own. Tried taking Aleve with no benefit. She feels that it may come in clusters of multiple events a week and then none for a few weeks. No other acute concerns.  Has some snoring episodes, no noted apnea events. No noted fatigue during the daytime.   Has been extensively worked up for possible sinusitis as etiology of symptoms. Recent workup, including head CT, is unremarkable.   Patient has no history of CAD, reports history of HTN and HLD but levels currently normal and on no medications.   Review of Systems: Out of a complete 14 system review, the patient complains of only the following symptoms, and all other reviewed systems are negative. Other for eye pain wheezing snoring feeling hot joint pain dizziness ringing in ears allergies skin sensitivity  History   Social History  . Marital Status: Divorced    Spouse Name: N/A    Number of Children: 2  . Years of Education: 11   Occupational History  .  [employer]   Social History Main Topics  . Smoking status: Current Every Day Smoker -- 0.50 packs/day    Types: Cigarettes  . Smokeless tobacco: Never Used  . Alcohol Use: No  . Drug Use: No  . Sexual Activity: Not Currently   Other Topics Concern  .  Not on file   Social History Narrative   Patient is single and lives at home with her mother.   Patient has 2 children.   Patient works at [employer].   Patient has a high school education.   Patient is right-handed.   Patient drinks one cup of coffee daily.    Family History  Problem Relation Age of Onset  . Cancer Mother   . Cancer Paternal Grandmother   . Cancer Paternal Grandfather     Past Medical History  Diagnosis Date  . Hypercholesteremia   . Asthma   . Depression   . Hypertension   . Arthritis   . Tendonitis     Past Surgical History  Procedure Laterality Date  . Joint replacement    . Hand surgery      Bilateral hands  . Shoulder surgery  2012  . Knee surgery  2012  . Foot surgery  2000    Current Outpatient Prescriptions  Medication Sig Dispense Refill  . albuterol (PROVENTIL HFA;VENTOLIN HFA) 108 (90 BASE) MCG/ACT inhaler Inhale 2 puffs into the lungs every 6 (six) hours as needed. Shortness of breath        . calcium carbonate (OS-CAL) 600 MG TABS Take 2,400 mg by mouth daily.        . cetirizine (ZYRTEC) 10 MG tablet Take 10 mg by mouth daily.      Marland Kitchen  cholecalciferol (VITAMIN D) 1000 UNITS tablet Take 1,000 Units by mouth daily.        Marland Kitchen FLUVIRIN PRESERVATIVE FREE SUSP injection       . guaiFENesin (MUCINEX) 600 MG 12 hr tablet Take 2 tablets (1,200 mg total) by mouth 2 (two) times daily.  60 tablet  0  . mometasone (NASONEX) 50 MCG/ACT nasal spray Place 2 sprays into the nose daily.      Marland Kitchen PNEUMOVAX 23 25 MCG/0.5ML injection        No current facility-administered medications for this visit.    Allergies as of 11/22/2012 - Review Complete 11/22/2012  Allergen Reaction Noted  . Celebrex [celecoxib] Hives 12/13/2010  . Codeine Nausea Only 12/13/2010  . Etodolac Other (See Comments) 12/13/2010  . Percocet [oxycodone-acetaminophen] Itching 12/13/2010    Vitals: BP 116/93  Pulse 86  Ht 4' 11.5" (1.511 m)  Wt 226 lb (102.513 kg)  BMI 44.9  kg/m2 Last Weight:  Wt Readings from Last 1 Encounters:  11/22/12 226 lb (102.513 kg)   Last Height:   Ht Readings from Last 1 Encounters:  11/22/12 4' 11.5" (1.511 m)     Physical exam: Exam: Gen: NAD, conversant Eyes: anicteric sclerae, moist conjunctivae HENT: Atraumatic, oropharynx clear Neck: Trachea midline; supple,  Lungs: CTA, no wheezing, rales, rhonic                          CV: RRR, no MRG Abdomen: Soft, non-tender;  Extremities: No peripheral edema  Skin: Normal temperature, no rash,  Psych: Appropriate affect, pleasant  Neuro: MS: AA&Ox3, appropriately interactive, normal affect   Speech: fluent w/o paraphasic error  Memory: good recent and remote recall  CN: PERRL, EOMI no nystagmus, no ptosis, sensation intact to LT V1-V3 bilat, face symmetric, no weakness, hearing grossly intact, palate elevates symmetrically, shoulder shrug 5/5 bilat,  tongue protrudes midline, no fasiculations noted.  Motor: normal bulk and tone Strength: 5/5  In all extremities  Coord: rapid alternating and point-to-point (FNF, HTS) movements intact.  Reflexes: symmetrical, bilat downgoing toes  Sens: LT intact in all extremities  Gait: posture, stance, stride and arm-swing normal. Tandem gait intact. Able to walk on heels and toes. Romberg absent.   Assessment:  After physical and neurologic examination, review of laboratory studies, imaging, neurophysiology testing and pre-existing records, assessment will be reviewed on the problem list.  Plan:  Treatment plan and additional workup will be reviewed under Problem List.  1)Headache  52y/o woman presenting for initial evaluation of headache. Physical exam unremarkable. Patients history is consistent with possible cluster headache or migraine/cluster variant. Will try patient on low dose imitrex as abortive therapy. If no benefit will consider switch to oxygen therapy (feel this is likely migraine/cluster variant therefore will  start with imitrex).  Patient is post menopausal which theoretically increases risk of triptan usage but she denies any history of cardiac disease, denies any current HTN or HLD (on no medications). Follow up in 4 months.

## 2012-11-22 NOTE — Patient Instructions (Signed)
Overall you are doing fairly well but I do want to suggest a few things today:   Remember to drink plenty of fluid, eat healthy meals and do not skip any meals. Try to eat protein with a every meal and eat a healthy snack such as fruit or nuts in between meals. Try to keep a regular sleep-wake schedule and try to exercise daily, particularly in the form of walking, 20-30 minutes a day, if you can.   As far as your medications are concerned, I would like to suggest trying a medication called Imitrex 25mg . Take one tablet upon headache onset, if the symptoms do not improve then take an additional one tablet 1 to 2 hours later. Max of 2 tablets in 24 hours.   I would like to see you back in 4 months, sooner if we need to. Please call us with any interim questions, concerns, problems, updates or refill requests.   Please also call us for any test results so we can go over those with you on the phone.  My clinical assistant and will answer any of your questions and relay your messages to me and also relay most of my messages to you.   Our phone number is [phone]. We also have an after hours call service for urgent matters and there is a physician on-call for urgent questions. For any emergencies you know to call 911 or go to the nearest emergency room

## 2012-12-28 ENCOUNTER — Telehealth: Payer: Self-pay | Admitting: Neurology

## 2013-03-15 ENCOUNTER — Other Ambulatory Visit (HOSPITAL_COMMUNITY)
Admission: RE | Admit: 2013-03-15 | Discharge: 2013-03-15 | Disposition: A | Discharge status: Home / Self Care | Payer: BC Managed Care – PPO | Source: Ambulatory Visit | Attending: Family Medicine | Admitting: Family Medicine

## 2013-03-15 ENCOUNTER — Other Ambulatory Visit: Payer: Self-pay | Admitting: Family Medicine

## 2013-03-15 DIAGNOSIS — Z1151 Encounter for screening for human papillomavirus (HPV): Secondary | ICD-10-CM | POA: Insufficient documentation

## 2013-03-15 DIAGNOSIS — Z124 Encounter for screening for malignant neoplasm of cervix: Secondary | ICD-10-CM | POA: Insufficient documentation

## 2013-03-26 ENCOUNTER — Ambulatory Visit: Payer: BC Managed Care – PPO | Admitting: Neurology

## 2013-03-26 ENCOUNTER — Telehealth: Payer: Self-pay | Admitting: Neurology

## 2013-03-26 NOTE — Telephone Encounter (Signed)
No show for 03/26/2013 follow up appointment

## 2013-05-07 IMAGING — CT CT HEAD W/O CM
1 of 2 series · 16 of 30 positions shown, 20 images · non-contrast
Comparison: 12/29/2010

CLINICAL DATA: Headache

CT HEAD WITHOUT CONTRAST
TECHNIQUE: Contiguous axial images were obtained from the base of
the skull through the vertex without contrast.

[Series 2: head 4.8 h37s · axial · 0.48mm/px · z∈[+1156,+1300]mm · 16 of 32 slices shown, 20 images]
[im 2/32  brain]
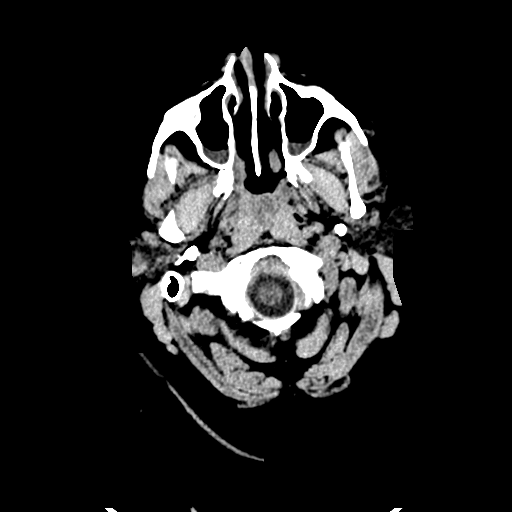
[im 2/32  bone]
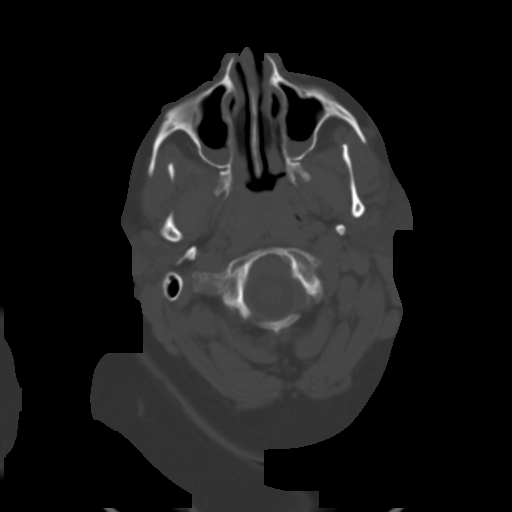
[im 3/32  brain]
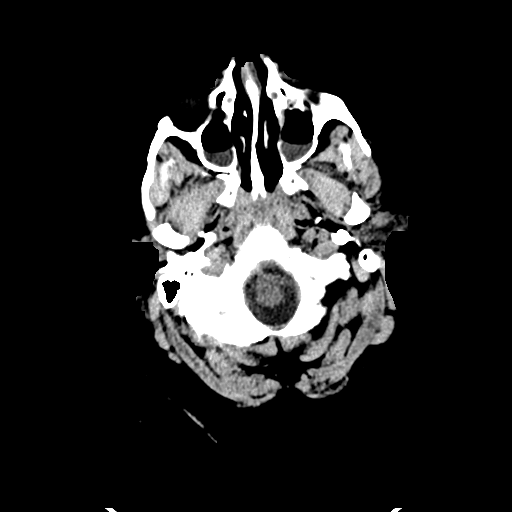
[im 6/32  brain]
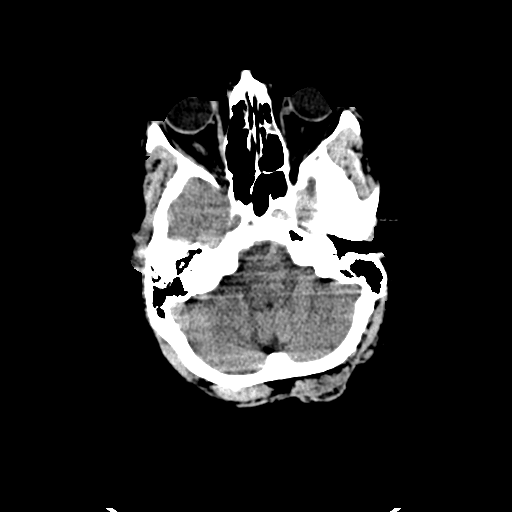
[im 8/32  brain]
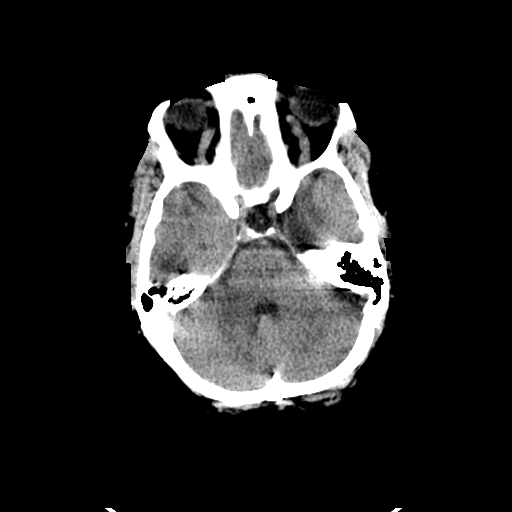
[im 9/32  brain]
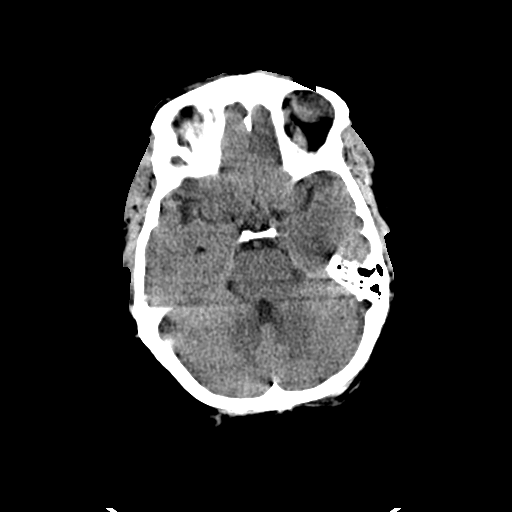
[im 9/32  bone]
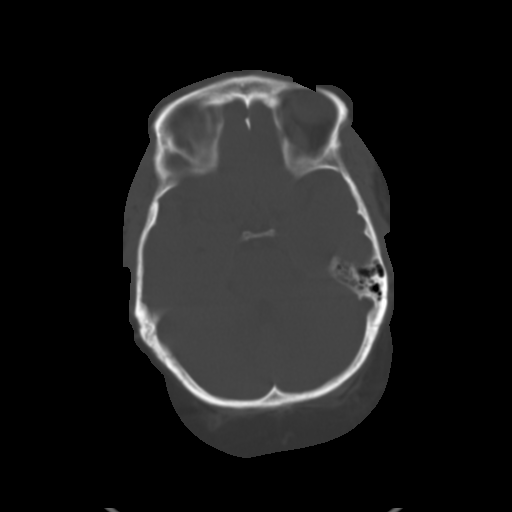
[im 11/32  brain]
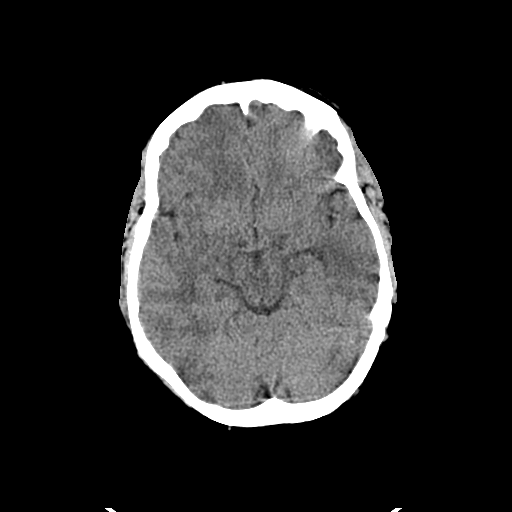
[im 14/32  brain]
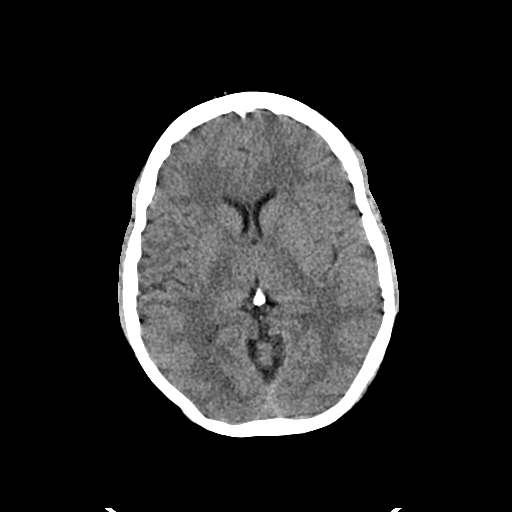
[im 15/32  brain]
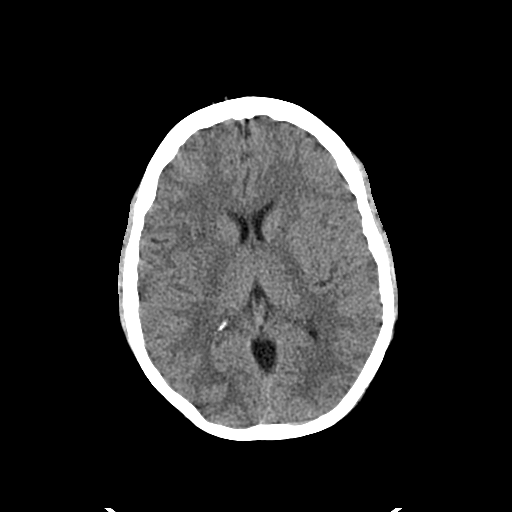
[im 17/32  brain]
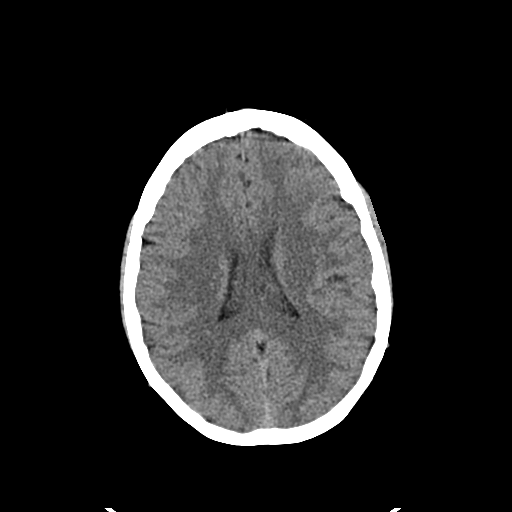
[im 17/32  bone]
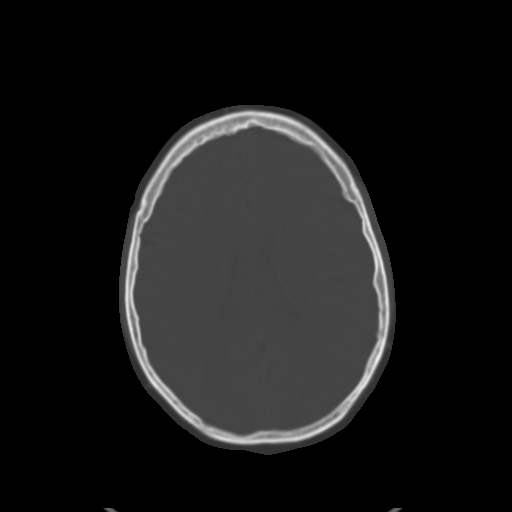
[im 18/32  brain]
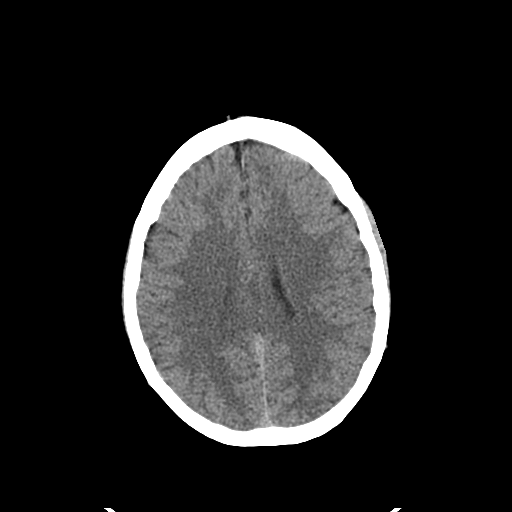
[im 21/32  brain]
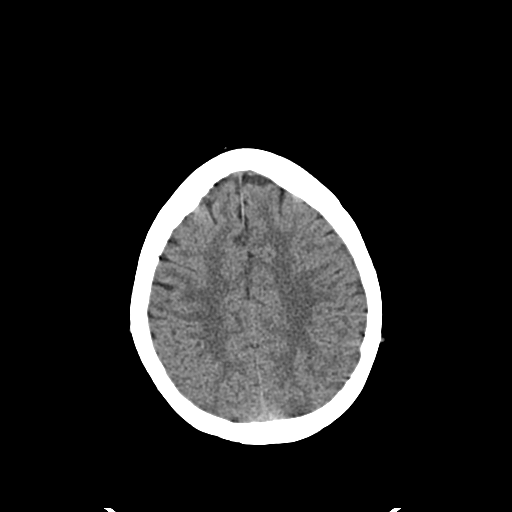
[im 23/32  brain]
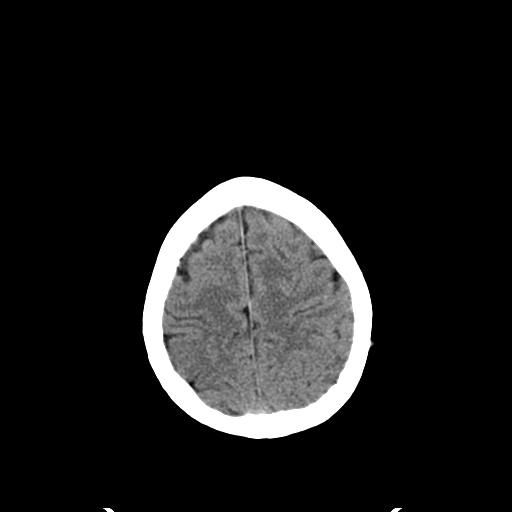
[im 24/32  brain]
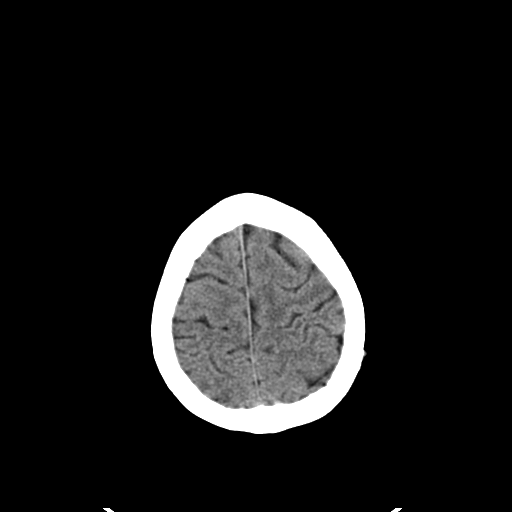
[im 24/32  bone]
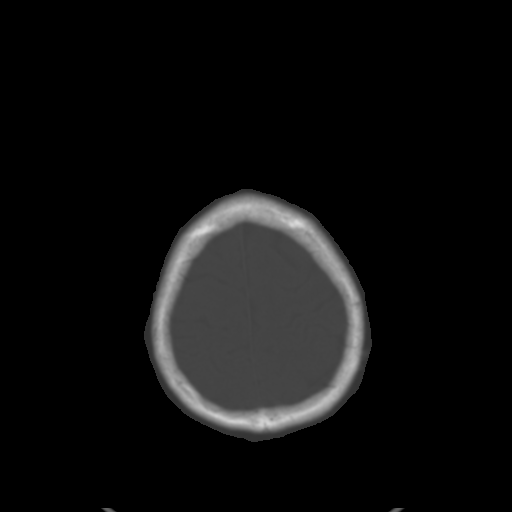
[im 26/32  brain]
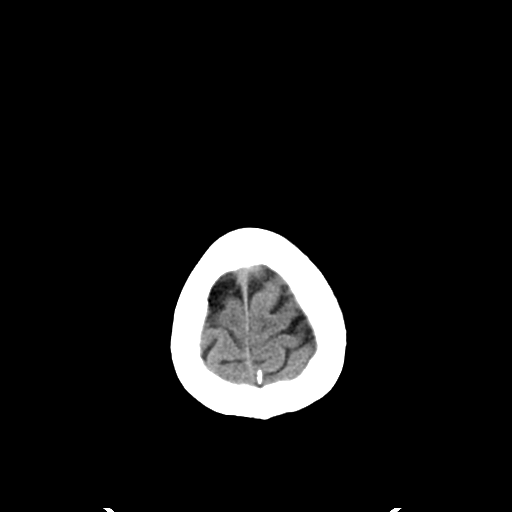
[im 29/32  brain]
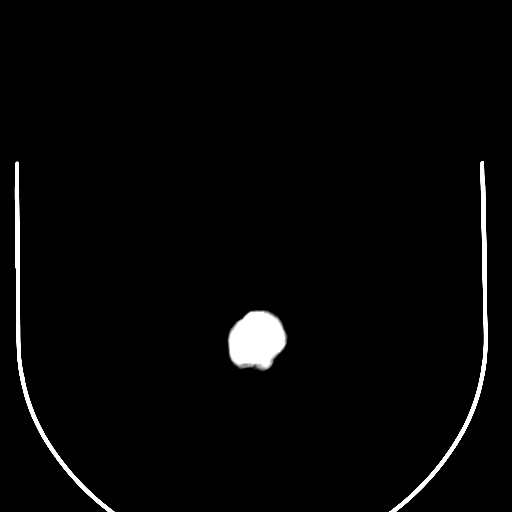
[im 30/32  brain]
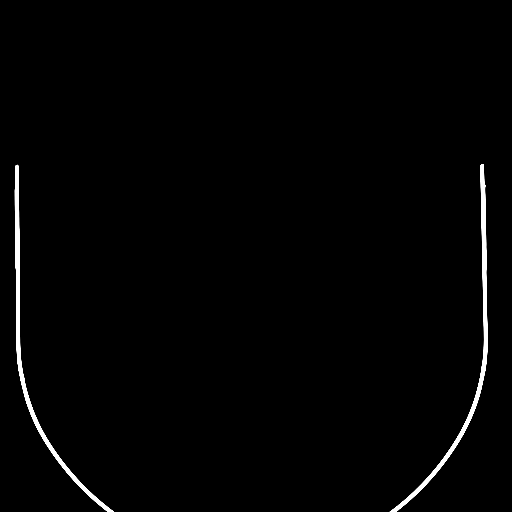

[16 of 30 positions shown; findings below may reference images not displayed]

FINDINGS: Air fluid levels are noted in both maxillary sinuses.
Mucosal thickening in the ethmoid air cells and maxillary sinuses.
Mastoid air cells on the left containing small amount of fluid.
Right mastoid air cells clear.

Adenoidal lymphoid tissue is prominent.

No mass effect, midline shift, or acute intracranial hemorrhage.

Streak artifact limits the study.
IMPRESSION: Inflammatory changes in the paranasal sinuses are present including
air fluid levels in the maxillary sinuses.

Otherwise no acute intracranial pathology.

## 2013-07-24 NOTE — Telephone Encounter (Signed)
Closing encounter

## 2013-11-04 ENCOUNTER — Encounter (HOSPITAL_BASED_OUTPATIENT_CLINIC_OR_DEPARTMENT_OTHER): Payer: Self-pay | Admitting: Emergency Medicine

## 2013-11-04 ENCOUNTER — Emergency Department (HOSPITAL_BASED_OUTPATIENT_CLINIC_OR_DEPARTMENT_OTHER)
Admission: EM | Admit: 2013-11-04 | Discharge: 2013-11-04 | Disposition: A | Discharge status: Home / Self Care | Payer: BC Managed Care – PPO | Attending: Emergency Medicine | Admitting: Emergency Medicine

## 2013-11-04 DIAGNOSIS — M1990 Unspecified osteoarthritis, unspecified site: Secondary | ICD-10-CM | POA: Diagnosis not present

## 2013-11-04 DIAGNOSIS — F329 Major depressive disorder, single episode, unspecified: Secondary | ICD-10-CM | POA: Insufficient documentation

## 2013-11-04 DIAGNOSIS — J0101 Acute recurrent maxillary sinusitis: Secondary | ICD-10-CM | POA: Insufficient documentation

## 2013-11-04 DIAGNOSIS — R51 Headache: Secondary | ICD-10-CM | POA: Diagnosis present

## 2013-11-04 DIAGNOSIS — Z79899 Other long term (current) drug therapy: Secondary | ICD-10-CM | POA: Insufficient documentation

## 2013-11-04 DIAGNOSIS — J45909 Unspecified asthma, uncomplicated: Secondary | ICD-10-CM | POA: Diagnosis not present

## 2013-11-04 DIAGNOSIS — Z720 Tobacco use: Secondary | ICD-10-CM | POA: Diagnosis not present

## 2013-11-04 DIAGNOSIS — I10 Essential (primary) hypertension: Secondary | ICD-10-CM | POA: Diagnosis not present

## 2013-11-04 DIAGNOSIS — Z792 Long term (current) use of antibiotics: Secondary | ICD-10-CM | POA: Insufficient documentation

## 2013-11-04 DIAGNOSIS — Z7951 Long term (current) use of inhaled steroids: Secondary | ICD-10-CM | POA: Diagnosis not present

## 2013-11-04 MED ORDER — AMOXICILLIN-POT CLAVULANATE 500-125 MG PO TABS: 1.0000 | ORAL_TABLET | Freq: Three times a day (TID) | ORAL | Status: AC

## 2013-11-04 MED ORDER — TRAMADOL HCL 50 MG PO TABS
50.0000 mg | ORAL_TABLET | Freq: Four times a day (QID) | ORAL | Status: AC | PRN
Start: 2013-11-04 — End: ?

## 2013-11-04 NOTE — ED Provider Notes (Signed)
CSN: [id number]     Arrival date & time 11/04/13  1700 History  This chart was scribed for Nelia Shiobert L Gaia Gullikson, MD by Annye AsaAnna Dorsett, ED Scribe. This patient was seen in room MH03/MH03 and the patient's care was started at 5:39 PM.    Chief Complaint  Patient presents with  . Headache   The history is provided by the patient. No language interpreter was used.    HPI Comments: Vevelyn FrancoisKeen T Barcia is a 53 y.o. female who presents to the Emergency Department complaining of 1 week of intermittent headaches, worsening today. She explains that her pain is localized bilaterally in her temples; she reports sinus pressure and congestion. She takes OTC pain medications with minimal relief. Patient reports experience with similar symptoms previously, at which time she was diagnosed with sinus disease (2013); she states that her doctor says she has "sinus problems." She has not been on any antibiotics in the last month. Patient reports that she utilizes an inhaler daily for wheezing Elwin Sleight([place]).   She also notes pain in her left neck and shoulder.    Past Medical History  Diagnosis Date  . Hypercholesteremia   . Asthma   . Depression   . Hypertension   . Arthritis   . Tendonitis    Past Surgical History  Procedure Laterality Date  . Joint replacement    . Hand surgery      Bilateral hands  . Shoulder surgery  2012  . Knee surgery  2012  . Foot surgery  2000  . Tubal ligation     Family History  Problem Relation Age of Onset  . Cancer Mother   . Cancer Paternal Grandmother   . Cancer Paternal Grandfather    History  Substance Use Topics  . Smoking status: Current Every Day Smoker -- 0.50 packs/day    Types: Cigarettes  . Smokeless tobacco: Never Used  . Alcohol Use: No   OB History   Grav Para Term Preterm Abortions TAB SAB Ect Mult Living                 Review of Systems  A complete 10 system review of systems was obtained and all systems are negative except as noted in the HPI and PMH.    Allergies  Celebrex; Codeine; Etodolac; and Percocet  Home Medications   Prior to Admission medications   Medication Sig Start Date End Date Taking? Authorizing Provider  albuterol (PROVENTIL HFA;VENTOLIN HFA) 108 (90 BASE) MCG/ACT inhaler Inhale 2 puffs into the lungs every 6 (six) hours as needed. Shortness of breath     Yes Historical Provider, MD  calcium carbonate (OS-CAL) 600 MG TABS Take 2,400 mg by mouth daily.     Yes Historical Provider, MD  cetirizine (ZYRTEC) 10 MG tablet Take 10 mg by mouth daily.   Yes Historical Provider, MD  cholecalciferol (VITAMIN D) 1000 UNITS tablet Take 1,000 Units by mouth daily.     Yes Historical Provider, MD  mometasone (NASONEX) 50 MCG/ACT nasal spray Place 2 sprays into the nose daily.   Yes Historical Provider, MD  Vitamin D, Ergocalciferol, (DRISDOL) 50000 UNITS CAPS capsule Take 50,000 Units by mouth 2 (two) times daily.   Yes Historical Provider, MD  amoxicillin-clavulanate (AUGMENTIN) 500-125 MG per tablet Take 1 tablet (500 mg total) by mouth every 8 (eight) hours. 11/04/13   Nelia Shiobert L Annalissa Murphey, MD  FLUVIRIN PRESERVATIVE FREE SUSP injection  11/04/12   Historical Provider, MD  guaiFENesin (MUCINEX) 600 MG 12 hr  tablet Take 2 tablets (1,200 mg total) by mouth 2 (two) times daily. 10/22/12   Phill MutterPeter S Dammen, PA-C  PNEUMOVAX 23 25 MCG/0.5ML injection  11/04/12   Historical Provider, MD  SUMAtriptan (IMITREX) 25 MG tablet Take 1 tablet (25 mg total) by mouth every 2 (two) hours as needed for migraine. Take one tablet at headache onset, if no improvement then take additional tablet 1 to 2 hours later. Do not use more then 2 tablets in 24hrs. 11/22/12   Omelia BlackwaterPeter Justin Sumner, DO  traMADol (ULTRAM) 50 MG tablet Take 1 tablet (50 mg total) by mouth every 6 (six) hours as needed. 11/04/13   Nelia Shiobert L Karanveer Ramakrishnan, MD   BP 142/98  Pulse 77  Temp(Src) 98.2 F (36.8 C) (Oral)  Resp 16  Ht 5' (1.524 m)  Wt 210 lb (95.255 kg)  BMI 41.01 kg/m2  SpO2  100% Physical Exam  Nursing note and vitals reviewed. Constitutional: She is oriented to person, place, and time. She appears well-developed and well-nourished. No distress.  HENT:  Head: Normocephalic and atraumatic.    Mouth/Throat: No oropharyngeal exudate.  Maxillary tenderness to percussion  Eyes: Pupils are equal, round, and reactive to light.  Neck: Normal range of motion.  Cardiovascular: Normal rate and intact distal pulses.   Pulmonary/Chest: No respiratory distress.  Abdominal: Normal appearance. She exhibits no distension.  Musculoskeletal: Normal range of motion.  Neurological: She is alert and oriented to person, place, and time. No cranial nerve deficit.  Skin: Skin is warm and dry. No rash noted.  Psychiatric: She has a normal mood and affect. Her behavior is normal.    ED Course  Procedures   DIAGNOSTIC STUDIES: Oxygen Saturation is 99% on RA, normal by my interpretation.    COORDINATION OF CARE: 10:05 PM Discussed treatment plan with pt at bedside and pt agreed to plan. Patient will be discharged with antibiotics.    Labs Review Labs Reviewed - No data to display  Imaging Review No results found.   EKG Interpretation None      MDM   Final diagnoses:  Recurrent maxillary sinusitis, unspecified chronicity   I personally performed the services described in this documentation, which was scribed in my presence. The recorded information has been reviewed and considered.     Nelia Shiobert L Keishana Klinger, MD 11/15/13 2206

## 2013-11-04 NOTE — ED Notes (Signed)
Pt c/o headache x 1 week with pain in left shoulder and neck

## 2013-11-04 NOTE — Discharge Instructions (Signed)
Sinusitis °Sinusitis is redness, soreness, and puffiness (inflammation) of the air pockets in the bones of your face (sinuses). The redness, soreness, and puffiness can cause air and mucus to get trapped in your sinuses. This can allow germs to grow and cause an infection.  °HOME CARE  °· Drink enough fluids to keep your pee (urine) clear or pale yellow. °· Use a humidifier in your home. °· Run a hot shower to create steam in the bathroom. Sit in the bathroom with the door closed. Breathe in the steam 3-4 times a day. °· Put a warm, moist washcloth on your face 3-4 times a day, or as told by your doctor. °· Use salt water sprays (saline sprays) to wet the thick fluid in your nose. This can help the sinuses drain. °· Only take medicine as told by your doctor. °GET HELP RIGHT AWAY IF:  °· Your pain gets worse. °· You have very bad headaches. °· You are sick to your stomach (nauseous). °· You throw up (vomit). °· You are very sleepy (drowsy) all the time. °· Your face is puffy (swollen). °· Your vision changes. °· You have a stiff neck. °· You have trouble breathing. °MAKE SURE YOU:  °· Understand these instructions. °· Will watch your condition. °· Will get help right away if you are not doing well or get worse. °Document Released: 06/30/2007 Document Revised: 10/06/2011 Document Reviewed: 08/17/2011 °[employer]® Patient Information ©2015 [employer]. This information is not intended to replace advice given to you by your health care provider. Make sure you discuss any questions you have with your health care provider. ° °
